# Patient Record
Sex: Female | Born: 1980 | Race: Black or African American | Hispanic: No | Marital: Single | Smoking: Former smoker
Health system: Southern US, Community
[De-identification: ages and names within clinical notes are randomized; demographics above are authoritative.]

## PROBLEM LIST (undated history)

## (undated) DIAGNOSIS — IMO0002 Reserved for concepts with insufficient information to code with codable children: Secondary | ICD-10-CM

## (undated) DIAGNOSIS — R87619 Unspecified abnormal cytological findings in specimens from cervix uteri: Secondary | ICD-10-CM

## (undated) DIAGNOSIS — K649 Unspecified hemorrhoids: Secondary | ICD-10-CM

## (undated) DIAGNOSIS — Z8619 Personal history of other infectious and parasitic diseases: Secondary | ICD-10-CM

## (undated) DIAGNOSIS — R079 Chest pain, unspecified: Secondary | ICD-10-CM

## (undated) HISTORY — DX: Unspecified abnormal cytological findings in specimens from cervix uteri: R87.619

## (undated) HISTORY — PX: NO PAST SURGERIES: SHX2092

## (undated) HISTORY — PX: OTHER SURGICAL HISTORY: SHX169

## (undated) HISTORY — DX: Reserved for concepts with insufficient information to code with codable children: IMO0002

## (undated) HISTORY — DX: Personal history of other infectious and parasitic diseases: Z86.19

---

## 2002-08-29 ENCOUNTER — Emergency Department (HOSPITAL_COMMUNITY): Admission: EM | Admit: 2002-08-29 | Discharge: 2002-08-30 | Payer: Self-pay | Admitting: Emergency Medicine

## 2006-07-30 ENCOUNTER — Emergency Department (HOSPITAL_COMMUNITY): Admission: EM | Admit: 2006-07-30 | Discharge: 2006-07-30 | Payer: Self-pay | Admitting: Emergency Medicine

## 2011-06-16 NOTE — L&D Delivery Note (Deleted)
Delivery Note At 9:35 AM a viable female was delivered via Vaginal, Vacuum (Extractor) (Presentation: Left Occiput Anterior).  APGAR: 8, 9; weight 8 lb 1.5 oz (3670 g).   Placenta status: Intact, Spontaneous.  Cord: 3 vessels  Anesthesia: Epidural Local  Episiotomy: None Lacerations: Bilateral hymenal ring tears Suture Repair: 3.0 vicryl Est. Blood Loss (mL): 400  Mom to postpartum.  Baby to nursery-stable.  Vonya Ohalloran H. 05/05/2012, 10:32 AM

## 2011-06-16 NOTE — L&D Delivery Note (Signed)
   Operative Delivery Note At 9:35 AM a viable and healthy female was delivered via Vaginal, Vacuum Investment banker, operational).  Presentation: vertex; Position: Left,, Occiput,, Anterior; Station: +5.  Verbal consent: obtained from patient.  Risks and benefits discussed in detail.  Risks include, but are not limited to the risks of anesthesia, bleeding, infection, damage to maternal tissues, fetal cephalhematoma.  There is also the risk of inability to effect vaginal delivery of the head, or shoulder dystocia that cannot be resolved by established maneuvers, leading to the need for emergency cesarean section.  APGAR: 8, 9; weight 8 lb 1.5 oz (3670 g).   Placenta status: Intact, Spontaneous.   Cord: 3 vessels  The patient was noted to be developing exhaustion but ineffectively pushing.  The option of vacuum assistance was offered due to maternal exhaustion.  The vacuum was applied with 4 pop-offs.  The vacuum was unable to generate adequate suction due to the abundance of hair on the infants head.  With a combination of maternal pushing and vacuum assistance delivered the infants head to crowning.  The vacuum was disengaged and the infants body was then delivered.  The cord was clamped and cut and the infant was passed to the isolet.  The placenta was delivered spontaneously, intact, with 3 vessel cord.  Bilateral hymenal ring tears were repaired with 3-0 vicryl.  EBL 400cc    Anesthesia: Epidural Local  Instruments: Kiwi vacuum Episiotomy: None Lacerations:  Suture Repair: 3.0 vicryl Est. Blood Loss (mL): 400  Mom to postpartum.  Baby to nursery-stable.  Zelma Mazariego H. 05/05/2012, 12:50 PM

## 2011-10-20 LAB — OB RESULTS CONSOLE RPR: RPR: NONREACTIVE

## 2011-10-20 LAB — OB RESULTS CONSOLE GC/CHLAMYDIA
Chlamydia: NEGATIVE
Gonorrhea: NEGATIVE

## 2011-10-20 LAB — OB RESULTS CONSOLE RUBELLA ANTIBODY, IGM: Rubella: IMMUNE

## 2011-10-20 LAB — OB RESULTS CONSOLE HEPATITIS B SURFACE ANTIGEN: Hepatitis B Surface Ag: NEGATIVE

## 2012-01-17 ENCOUNTER — Emergency Department (HOSPITAL_COMMUNITY)
Admission: EM | Admit: 2012-01-17 | Discharge: 2012-01-17 | Disposition: A | Discharge status: Home / Self Care | Payer: BC Managed Care – PPO | Attending: Emergency Medicine | Admitting: Emergency Medicine

## 2012-01-17 ENCOUNTER — Encounter (HOSPITAL_COMMUNITY): Payer: Self-pay | Admitting: *Deleted

## 2012-01-17 DIAGNOSIS — B86 Scabies: Secondary | ICD-10-CM

## 2012-01-17 DIAGNOSIS — R21 Rash and other nonspecific skin eruption: Secondary | ICD-10-CM | POA: Insufficient documentation

## 2012-01-17 MED ORDER — PERMETHRIN 5 % EX CREA: TOPICAL_CREAM | CUTANEOUS | Status: AC

## 2012-01-17 NOTE — ED Provider Notes (Signed)
History     CSN: [id number]  Arrival date & time 01/17/12  1731   First MD Initiated Contact with Patient 01/17/12 1747      Chief Complaint  Patient presents with  . Rash    (Consider location/radiation/quality/duration/timing/severity/associated sxs/prior treatment) HPI Comments: Patient presents today with a rash that has been present for the past 5 days.  Rash does itch, but is not painful.  Rash located on the web spaces of both hands and the lateral and posterior aspects of both feet.  She has never had a rash like this before.  She has tried putting Hydrocortisone on the rash, but does not feel that it has helped.  She denies fever or chills.  The history is provided by the patient.    History reviewed. No pertinent past medical history.  History reviewed. No pertinent past surgical history.  No family history on file.  History  Substance Use Topics  . Smoking status: Not on file  . Smokeless tobacco: Not on file  . Alcohol Use: No    OB History    Grav Para Term Preterm Abortions TAB SAB Ect Mult Living                  Review of Systems  Constitutional: Negative for fever and chills.  HENT: Negative for sore throat.   Gastrointestinal: Negative for nausea and vomiting.  Skin: Positive for rash.  Neurological: Negative for headaches.    Allergies  Review of patient's allergies indicates no known allergies.  Home Medications   Current Outpatient Rx  Name Route Sig Dispense Refill  . PRENATAL 27-0.8 MG PO TABS Oral Take 1 tablet by mouth daily.      BP 119/59  Pulse 88  Temp 97.8 F (36.6 C) (Oral)  Resp 16  SpO2 99%  Physical Exam  Nursing note and vitals reviewed. Constitutional: She appears well-developed and well-nourished. No distress.  HENT:  Head: Normocephalic and atraumatic.  Mouth/Throat: Oropharynx is clear and moist.  Neck: Normal range of motion. Neck supple.  Cardiovascular: Normal rate, regular rhythm and normal heart sounds.    Pulmonary/Chest: Effort normal and breath sounds normal.  Neurological: She is alert.  Skin: Skin is warm and dry. Rash noted. No petechiae and no purpura noted. She is not diaphoretic.       Papular rash and burrows located in the web spaces of the fingers, the web spaces of the toes, and the posterior/lateral portion of both feet.  Rash pruitic.    Psychiatric: She has a normal mood and affect.    ED Course  Procedures (including critical care time)  Labs Reviewed - No data to display No results found.   No diagnosis found.    MDM  Rash consistent with Scabies.  Patient treated with Permethrin cream.        Pascal Lux Ethel, PA-C 01/17/12 2037

## 2012-01-17 NOTE — ED Notes (Signed)
Discharge instructions reviewed w/ pt., verbalized understanding. One prescription provided at discharge.

## 2012-01-18 NOTE — ED Provider Notes (Signed)
Medical screening examination/treatment/procedure(s) were performed by non-physician practitioner and as supervising physician I was immediately available for consultation/collaboration.  Doug Sou, MD 01/18/12 [phone]

## 2012-02-14 ENCOUNTER — Inpatient Hospital Stay (HOSPITAL_COMMUNITY): Admission: AD | Admit: 2012-02-14 | Payer: Self-pay | Source: Ambulatory Visit | Admitting: Obstetrics and Gynecology

## 2012-05-02 ENCOUNTER — Encounter (HOSPITAL_COMMUNITY): Payer: Self-pay | Admitting: *Deleted

## 2012-05-02 ENCOUNTER — Telehealth (HOSPITAL_COMMUNITY): Payer: Self-pay | Admitting: *Deleted

## 2012-05-02 NOTE — Telephone Encounter (Signed)
Preadmission screen  

## 2012-05-03 ENCOUNTER — Telehealth (HOSPITAL_COMMUNITY): Payer: Self-pay | Admitting: *Deleted

## 2012-05-03 ENCOUNTER — Encounter (HOSPITAL_COMMUNITY): Payer: Self-pay | Admitting: *Deleted

## 2012-05-03 NOTE — Telephone Encounter (Signed)
Preadmission screen  

## 2012-05-05 ENCOUNTER — Inpatient Hospital Stay (HOSPITAL_COMMUNITY)
Admission: AD | Admit: 2012-05-05 | Discharge: 2012-05-07 | DRG: 373 | Disposition: A | Discharge status: Home / Self Care | Payer: BC Managed Care – PPO | Source: Ambulatory Visit | Attending: Obstetrics and Gynecology | Admitting: Obstetrics and Gynecology

## 2012-05-05 ENCOUNTER — Encounter (HOSPITAL_COMMUNITY): Payer: Self-pay | Admitting: *Deleted

## 2012-05-05 ENCOUNTER — Encounter (HOSPITAL_COMMUNITY): Payer: Self-pay | Admitting: Anesthesiology

## 2012-05-05 ENCOUNTER — Inpatient Hospital Stay (HOSPITAL_COMMUNITY): Payer: BC Managed Care – PPO | Admitting: Anesthesiology

## 2012-05-05 ENCOUNTER — Inpatient Hospital Stay (HOSPITAL_COMMUNITY): Admission: RE | Admit: 2012-05-05 | Payer: BC Managed Care – PPO | Source: Ambulatory Visit

## 2012-05-05 DIAGNOSIS — O99892 Other specified diseases and conditions complicating childbirth: Secondary | ICD-10-CM | POA: Diagnosis present

## 2012-05-05 DIAGNOSIS — Z22330 Carrier of Group B streptococcus: Secondary | ICD-10-CM

## 2012-05-05 LAB — CBC
HCT: 38.7 % (ref 36.0–46.0)
MCH: 32 pg (ref 26.0–34.0)
MCHC: 34.4 g/dL (ref 30.0–36.0)
MCV: 93.3 fL (ref 78.0–100.0)
Platelets: 231 10*3/uL (ref 150–400)
RDW: 13.3 % (ref 11.5–15.5)
WBC: 13.9 10*3/uL — ABNORMAL HIGH (ref 4.0–10.5)

## 2012-05-05 MED ORDER — PENICILLIN G POTASSIUM 5000000 UNITS IJ SOLR
2.5000 10*6.[IU] | INTRAVENOUS | Status: DC
  Administered 2012-05-05: 2.5 10*6.[IU] via INTRAVENOUS
  Filled 2012-05-05 (×3): qty 2.5

## 2012-05-05 MED ORDER — PENICILLIN G POTASSIUM 5000000 UNITS IJ SOLR
5.0000 10*6.[IU] | Freq: Once | INTRAVENOUS | Status: AC
  Administered 2012-05-05: 5 10*6.[IU] via INTRAVENOUS
  Filled 2012-05-05: qty 5

## 2012-05-05 MED ORDER — ONDANSETRON HCL 4 MG/2ML IJ SOLN: 4.0000 mg | INTRAMUSCULAR | Status: DC | PRN

## 2012-05-05 MED ORDER — SIMETHICONE 80 MG PO CHEW: 80.0000 mg | CHEWABLE_TABLET | ORAL | Status: DC | PRN

## 2012-05-05 MED ORDER — FLEET ENEMA 7-19 GM/118ML RE ENEM: 1.0000 | ENEMA | RECTAL | Status: DC | PRN

## 2012-05-05 MED ORDER — CITRIC ACID-SODIUM CITRATE 334-500 MG/5ML PO SOLN: 30.0000 mL | ORAL | Status: DC | PRN

## 2012-05-05 MED ORDER — ZOLPIDEM TARTRATE 5 MG PO TABS: 5.0000 mg | ORAL_TABLET | Freq: Every evening | ORAL | Status: DC | PRN

## 2012-05-05 MED ORDER — OXYTOCIN BOLUS FROM INFUSION: 500.0000 mL | INTRAVENOUS | Status: DC

## 2012-05-05 MED ORDER — LACTATED RINGERS IV SOLN: 500.0000 mL | Freq: Once | INTRAVENOUS | Status: DC

## 2012-05-05 MED ORDER — OXYCODONE-ACETAMINOPHEN 5-325 MG PO TABS
1.0000 | ORAL_TABLET | ORAL | Status: DC | PRN
  Administered 2012-05-07: 1 via ORAL
  Filled 2012-05-05: qty 1

## 2012-05-05 MED ORDER — PHENYLEPHRINE 40 MCG/ML (10ML) SYRINGE FOR IV PUSH (FOR BLOOD PRESSURE SUPPORT)
80.0000 ug | PREFILLED_SYRINGE | INTRAVENOUS | Status: DC | PRN
  Filled 2012-05-05: qty 5

## 2012-05-05 MED ORDER — HYDROCORTISONE ACE-PRAMOXINE 1-1 % RE FOAM
1.0000 | Freq: Two times a day (BID) | RECTAL | Status: DC
  Administered 2012-05-05 (×2): 1 via RECTAL
  Filled 2012-05-05: qty 10

## 2012-05-05 MED ORDER — EPHEDRINE 5 MG/ML INJ
10.0000 mg | INTRAVENOUS | Status: DC | PRN
  Administered 2012-05-05: 10 mg via INTRAVENOUS
  Filled 2012-05-05: qty 4

## 2012-05-05 MED ORDER — LACTATED RINGERS IV SOLN
INTRAVENOUS | Status: DC
  Administered 2012-05-05 (×2): via INTRAVENOUS

## 2012-05-05 MED ORDER — OXYCODONE-ACETAMINOPHEN 5-325 MG PO TABS: 1.0000 | ORAL_TABLET | ORAL | Status: DC | PRN

## 2012-05-05 MED ORDER — EPHEDRINE 5 MG/ML INJ: 10.0000 mg | INTRAVENOUS | Status: DC | PRN

## 2012-05-05 MED ORDER — LANOLIN HYDROUS EX OINT: TOPICAL_OINTMENT | CUTANEOUS | Status: DC | PRN

## 2012-05-05 MED ORDER — BENZOCAINE-MENTHOL 20-0.5 % EX AERO
1.0000 "application " | INHALATION_SPRAY | CUTANEOUS | Status: DC | PRN
  Administered 2012-05-05: 1 via TOPICAL
  Filled 2012-05-05 (×3): qty 56

## 2012-05-05 MED ORDER — OXYTOCIN 40 UNITS IN LACTATED RINGERS INFUSION - SIMPLE MED
62.5000 mL/h | INTRAVENOUS | Status: DC
  Administered 2012-05-05: 62.5 mL/h via INTRAVENOUS
  Filled 2012-05-05: qty 1000

## 2012-05-05 MED ORDER — IBUPROFEN 600 MG PO TABS: 600.0000 mg | ORAL_TABLET | Freq: Four times a day (QID) | ORAL | Status: DC | PRN

## 2012-05-05 MED ORDER — PHENYLEPHRINE 40 MCG/ML (10ML) SYRINGE FOR IV PUSH (FOR BLOOD PRESSURE SUPPORT): 80.0000 ug | PREFILLED_SYRINGE | INTRAVENOUS | Status: DC | PRN

## 2012-05-05 MED ORDER — ACETAMINOPHEN 325 MG PO TABS: 650.0000 mg | ORAL_TABLET | ORAL | Status: DC | PRN

## 2012-05-05 MED ORDER — DIPHENHYDRAMINE HCL 25 MG PO CAPS: 25.0000 mg | ORAL_CAPSULE | Freq: Four times a day (QID) | ORAL | Status: DC | PRN

## 2012-05-05 MED ORDER — PRENATAL MULTIVITAMIN CH
1.0000 | ORAL_TABLET | Freq: Every day | ORAL | Status: DC
  Administered 2012-05-05 – 2012-05-07 (×3): 1 via ORAL
  Filled 2012-05-05 (×3): qty 1

## 2012-05-05 MED ORDER — TETANUS-DIPHTH-ACELL PERTUSSIS 5-2.5-18.5 LF-MCG/0.5 IM SUSP
0.5000 mL | Freq: Once | INTRAMUSCULAR | Status: AC
  Administered 2012-05-06: 0.5 mL via INTRAMUSCULAR
  Filled 2012-05-05 (×2): qty 0.5

## 2012-05-05 MED ORDER — LIDOCAINE HCL (PF) 1 % IJ SOLN
30.0000 mL | INTRAMUSCULAR | Status: DC | PRN
  Filled 2012-05-05: qty 30

## 2012-05-05 MED ORDER — LIDOCAINE HCL (PF) 1 % IJ SOLN
INTRAMUSCULAR | Status: DC | PRN
  Administered 2012-05-05: 4 mL
  Administered 2012-05-05: 30 mL via SUBCUTANEOUS
  Administered 2012-05-05 (×3): 4 mL

## 2012-05-05 MED ORDER — WITCH HAZEL-GLYCERIN EX PADS
1.0000 "application " | MEDICATED_PAD | CUTANEOUS | Status: DC | PRN
  Administered 2012-05-05: 1 via TOPICAL

## 2012-05-05 MED ORDER — IBUPROFEN 600 MG PO TABS
600.0000 mg | ORAL_TABLET | Freq: Four times a day (QID) | ORAL | Status: DC
  Administered 2012-05-05 – 2012-05-07 (×9): 600 mg via ORAL
  Filled 2012-05-05 (×9): qty 1

## 2012-05-05 MED ORDER — FENTANYL 2.5 MCG/ML BUPIVACAINE 1/10 % EPIDURAL INFUSION (WH - ANES)
14.0000 mL/h | INTRAMUSCULAR | Status: DC
  Administered 2012-05-05: 14 mL/h via EPIDURAL
  Filled 2012-05-05: qty 125

## 2012-05-05 MED ORDER — DIPHENHYDRAMINE HCL 50 MG/ML IJ SOLN: 12.5000 mg | INTRAMUSCULAR | Status: DC | PRN

## 2012-05-05 MED ORDER — LACTATED RINGERS IV SOLN
500.0000 mL | INTRAVENOUS | Status: DC | PRN
  Administered 2012-05-05: 500 mL via INTRAVENOUS

## 2012-05-05 MED ORDER — SENNOSIDES-DOCUSATE SODIUM 8.6-50 MG PO TABS
2.0000 | ORAL_TABLET | Freq: Every day | ORAL | Status: DC
  Administered 2012-05-05 – 2012-05-06 (×2): 2 via ORAL

## 2012-05-05 MED ORDER — ONDANSETRON HCL 4 MG PO TABS: 4.0000 mg | ORAL_TABLET | ORAL | Status: DC | PRN

## 2012-05-05 MED ORDER — DIBUCAINE 1 % RE OINT
1.0000 "application " | TOPICAL_OINTMENT | RECTAL | Status: DC | PRN
  Filled 2012-05-05: qty 28

## 2012-05-05 MED ORDER — ONDANSETRON HCL 4 MG/2ML IJ SOLN: 4.0000 mg | Freq: Four times a day (QID) | INTRAMUSCULAR | Status: DC | PRN

## 2012-05-05 NOTE — Progress Notes (Signed)
Pt states pain in a 8-9 when she has a contractions

## 2012-05-05 NOTE — MAU Note (Signed)
Pt feels like she was having contraction at midnight. Pt state she broke her water broke at about later pt states her water broke

## 2012-05-05 NOTE — OB Triage Provider Note (Signed)
S: Pt here for labor eval, and thinks that her water has broken. She felt a small gush, and the RN asked me to perform a SSE O: VSS, afeb FHT:135, moderate, 15X15 accels, no decels Toco: q2-3 min Breathing through contractions External:  Normal Vagina: normal, pooling of a large amount of clear mucous/fluid + Fern Cervix: 5/100/-1 A/P: SROM Active labor Reassuring M-F status RN will update the patient's private MD

## 2012-05-05 NOTE — H&P (Signed)
31 y.o. [redacted]w[redacted]d  G1P0 comes in c/o ctx beginning around 12am with SROM shortley thereafter.  Otherwise has good fetal movement and no bleeding.  Past Medical History  Diagnosis Date  . H/O varicella   . Abnormal Pap smear   . Hx of chlamydia infection     Past Surgical History  Procedure Date  . No past surgeries     OB History    Grav Para Term Preterm Abortions TAB SAB Ect Mult Living   1              # Outc Date GA Lbr Len/2nd Wgt Sex Del Anes PTL Lv   1 CUR               History   Social History  . Marital Status: Single    Spouse Name: N/A    Number of Children: N/A  . Years of Education: N/A   Occupational History  . Not on file.   Social History Main Topics  . Smoking status: Former Smoker    Quit date: 10/02/2011  . Smokeless tobacco: Not on file  . Alcohol Use: No  . Drug Use: No  . Sexually Active: Yes   Other Topics Concern  . Not on file   Social History Narrative  . No narrative on file   Review of patient's allergies indicates no known allergies.    Prenatal Transfer Tool  Maternal Diabetes: No Genetic Screening: Normal Maternal Ultrasounds/Referrals: Normal Fetal Ultrasounds or other Referrals:  None Maternal Substance Abuse:  No Significant Maternal Medications:  None Significant Maternal Lab Results: None  Other PNC: O+/RI, nl 1hr gct    Filed Vitals:   05/05/12 0700  BP: 117/74  Pulse: 113  Temp:   Resp: 18     Lungs/Cor:  NAD Abdomen:  soft, gravid Ex:  no cords, erythema SVE:  5/100/-1 at admission- currently 10/100/+2 FHTs:  155, good STV, NST R Toco:  q 1-3   A/P   Admit for labor  Epidural desired - given  GBS Pos - PCN started Alerted by RN at approx 4:30am that pt had epidural approx  3:30 and was now having repetative lates despite O2 and position change.  On review of strip and vital signs noted decrease pt's BP by approx 40 systolic from prior to epidural.  Advised to administer ephedrine and monitor.  On review  repetetive late decels resolved.  Alerted that pt was rechecked at approx 6:30am and was complete but 0 station with no maternal pressure or urge to push, some caput noted.   Philip Aspen

## 2012-05-05 NOTE — Progress Notes (Signed)
Per RN pt not feeling anything to assist in pushing.  Has done trial pushes and while patient is trying to push the head actually ascends.  RN turned down epidural and will start pushing again in 15 min.  New FSE placed prior to marked variability.  Asked RN to switch to external to see if marked variability is artifact.  Can hear FHT with external and appears normal.

## 2012-05-05 NOTE — Anesthesia Preprocedure Evaluation (Signed)

## 2012-05-05 NOTE — Progress Notes (Signed)
Pt comfortable with epidural.  FHT 150 with +accels.  Once late decel noted on recent contraction. Last several minutes with some marked variability - FSE in place.  Overall reassurring.   TOCO q1-3 SVE 10/100 +2 Will start pushing.

## 2012-05-05 NOTE — Progress Notes (Signed)
Dr. Claiborne Billings notified of fhr tracing repetitive lates and prolonged decels. TO given to give 10 mg ephedrine IVP. Told to call back if ineffective.

## 2012-05-05 NOTE — MAU Note (Signed)
Pt G1 at 41.1wks leaking clear fluid x 1 hr, having contractions.

## 2012-05-05 NOTE — Anesthesia Postprocedure Evaluation (Signed)
  Anesthesia Post-op Note  Patient: Marilyn Luna  Procedure(s) Performed: * No procedures listed *  Patient Location: Mother/Baby  Anesthesia Type:Epidural  Level of Consciousness: awake, alert  and oriented  Airway and Oxygen Therapy: Patient Spontanous Breathing  Post-op Pain: none  Post-op Assessment: Post-op Vital signs reviewed and Patient's Cardiovascular Status Stable  Post-op Vital Signs: Reviewed and stable  Complications: No apparent anesthesia complications

## 2012-05-05 NOTE — Anesthesia Procedure Notes (Signed)
Epidural Patient location during procedure: OB Start time: 05/05/2012 3:26 AM  Staffing Performed by: anesthesiologist   Preanesthetic Checklist Completed: patient identified, site marked, surgical consent, pre-op evaluation, timeout performed, IV checked, risks and benefits discussed and monitors and equipment checked  Epidural Patient position: sitting Prep: site prepped and draped and DuraPrep Patient monitoring: continuous pulse ox and blood pressure Approach: midline Injection technique: LOR air  Needle:  Needle type: Tuohy  Needle gauge: 17 G Needle length: 9 cm and 9 Needle insertion depth: 5 cm cm Catheter type: closed end flexible Catheter size: 19 Gauge Catheter at skin depth: 10 cm Test dose: negative  Assessment Events: blood not aspirated, injection not painful, no injection resistance, negative IV test and no paresthesia  Additional Notes Discussed risk of headache, infection, bleeding, nerve injury and failed or incomplete block.  Patient voices understanding and wishes to proceed. Reason for block:procedure for pain

## 2012-05-05 NOTE — Progress Notes (Signed)
Dr. Claiborne Billings notified of pt's SVE and FHR tracing with variables and lates. States she will come in and evaluate patient in person.

## 2012-05-06 LAB — CBC
HCT: 31.3 % — ABNORMAL LOW (ref 36.0–46.0)
Hemoglobin: 10.4 g/dL — ABNORMAL LOW (ref 12.0–15.0)
MCV: 94.6 fL (ref 78.0–100.0)
RDW: 13.5 % (ref 11.5–15.5)
WBC: 11.9 10*3/uL — ABNORMAL HIGH (ref 4.0–10.5)

## 2012-05-06 NOTE — Progress Notes (Signed)
Parents desires circumsision.  All risks, benefits and alternatives discussed with the mother. 

## 2012-05-06 NOTE — Progress Notes (Signed)
Patient is eating, ambulating, voiding.  Pain control is good.  Filed Vitals:   05/05/12 1115 05/05/12 1130 05/05/12 1625 05/05/12 2320  BP: 107/63 117/58 115/68 123/77  Pulse: 104 100 101 97  Temp:   99.6 F (37.6 C) 98.6 F (37 C)  TempSrc:   Oral Oral  Resp:   20 20  Height:      Weight:      SpO2:   99% 98%    Fundus firm Perineum without swelling.  Lab Results  Component Value Date   WBC 11.9* 05/06/2012   HGB 10.4* 05/06/2012   HCT 31.3* 05/06/2012   MCV 94.6 05/06/2012   PLT 192 05/06/2012    O/Positive/-- (05/07 0000)/RI  A/P Post partum day 1.  Routine care.  Expect d/c tomorrow.    Marilyn Luna A

## 2012-05-06 NOTE — Discharge Summary (Signed)
Obstetric Discharge Summary Reason for Admission: onset of labor Prenatal Procedures: none Intrapartum Procedures: vacuum Postpartum Procedures: none Complications-Operative and Postpartum: 1 degree perineal laceration Hemoglobin  Date Value Range Status  05/06/2012 10.4* 12.0 - 15.0 g/dL Final     REPEATED TO VERIFY     DELTA CHECK NOTED     HCT  Date Value Range Status  05/06/2012 31.3* 36.0 - 46.0 % Final    Discharge Diagnoses: Term Pregnancy-delivered  Discharge Information: Date: 05/06/2012 Activity: pelvic rest Diet: routine Medications: Ibuprofen Condition: stable Instructions: refer to practice specific booklet Discharge to: home Follow-up Information    Follow up with Almon Hercules., MD. Schedule an appointment as soon as possible for a visit in 4 weeks.   Contact information:   [address] [place] [address] [phone]          Newborn Data: Live born female  Birth Weight: 8 lb 1.5 oz (3670 g) APGAR: 8, 9  Home with mother.  Ostin Mathey A 05/06/2012, 8:01 AM

## 2012-05-07 NOTE — Progress Notes (Signed)
Patient is eating, ambulating, voiding.  Pain control is good.  Filed Vitals:   05/05/12 2320 05/06/12 1412 05/06/12 2211 05/07/12 0554  BP: 123/77 123/76 113/73 103/65  Pulse: 97 118 86 72  Temp: 98.6 F (37 C) 98 F (36.7 C) 98.2 F (36.8 C) 98.2 F (36.8 C)  TempSrc: Oral Oral Oral Oral  Resp: 20 18 20 18   Height:      Weight:      SpO2: 98%       Fundus firm Perineum without swelling.  Lab Results  Component Value Date   WBC 11.9* 05/06/2012   HGB 10.4* 05/06/2012   HCT 31.3* 05/06/2012   MCV 94.6 05/06/2012   PLT 192 05/06/2012    --/--/O POS (11/22 0515)/RI  A/P Post partum day 2.  Routine care.  Expect d/c today.    Melik Blancett A

## 2012-05-10 NOTE — Progress Notes (Signed)
Post discharge chart review completed.  

## 2012-07-30 ENCOUNTER — Other Ambulatory Visit: Payer: Self-pay

## 2013-04-20 ENCOUNTER — Other Ambulatory Visit: Payer: Self-pay

## 2014-04-16 ENCOUNTER — Encounter (HOSPITAL_COMMUNITY): Payer: Self-pay | Admitting: *Deleted

## 2014-04-20 ENCOUNTER — Emergency Department (HOSPITAL_COMMUNITY): Payer: No Typology Code available for payment source

## 2014-04-20 ENCOUNTER — Emergency Department (HOSPITAL_COMMUNITY)
Admission: EM | Admit: 2014-04-20 | Discharge: 2014-04-20 | Disposition: A | Discharge status: Home / Self Care | Payer: No Typology Code available for payment source | Attending: Emergency Medicine | Admitting: Emergency Medicine

## 2014-04-20 ENCOUNTER — Encounter (HOSPITAL_COMMUNITY): Payer: Self-pay | Admitting: Emergency Medicine

## 2014-04-20 DIAGNOSIS — S4991XA Unspecified injury of right shoulder and upper arm, initial encounter: Secondary | ICD-10-CM | POA: Insufficient documentation

## 2014-04-20 DIAGNOSIS — Z87891 Personal history of nicotine dependence: Secondary | ICD-10-CM | POA: Insufficient documentation

## 2014-04-20 DIAGNOSIS — Y9389 Activity, other specified: Secondary | ICD-10-CM | POA: Insufficient documentation

## 2014-04-20 DIAGNOSIS — Z8619 Personal history of other infectious and parasitic diseases: Secondary | ICD-10-CM | POA: Diagnosis not present

## 2014-04-20 DIAGNOSIS — Z79899 Other long term (current) drug therapy: Secondary | ICD-10-CM | POA: Insufficient documentation

## 2014-04-20 DIAGNOSIS — Y92410 Unspecified street and highway as the place of occurrence of the external cause: Secondary | ICD-10-CM | POA: Diagnosis not present

## 2014-04-20 DIAGNOSIS — S199XXA Unspecified injury of neck, initial encounter: Secondary | ICD-10-CM | POA: Diagnosis present

## 2014-04-20 DIAGNOSIS — M542 Cervicalgia: Secondary | ICD-10-CM

## 2014-04-20 MED ORDER — METHOCARBAMOL 500 MG PO TABS: 500.0000 mg | ORAL_TABLET | Freq: Two times a day (BID) | ORAL | Status: DC

## 2014-04-20 MED ORDER — IBUPROFEN 800 MG PO TABS
800.0000 mg | ORAL_TABLET | Freq: Once | ORAL | Status: AC
  Administered 2014-04-20: 800 mg via ORAL
  Filled 2014-04-20: qty 1

## 2014-04-20 MED ORDER — IBUPROFEN 800 MG PO TABS: 800.0000 mg | ORAL_TABLET | Freq: Three times a day (TID) | ORAL | Status: DC
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
## 2014-07-13 ENCOUNTER — Ambulatory Visit: Payer: Self-pay | Admitting: Family Medicine

## 2014-07-27 ENCOUNTER — Ambulatory Visit: Payer: Self-pay | Admitting: Family Medicine

## 2016-06-02 DIAGNOSIS — Z124 Encounter for screening for malignant neoplasm of cervix: Secondary | ICD-10-CM | POA: Diagnosis not present

## 2016-06-02 DIAGNOSIS — Z113 Encounter for screening for infections with a predominantly sexual mode of transmission: Secondary | ICD-10-CM | POA: Diagnosis not present

## 2016-06-02 DIAGNOSIS — Z6826 Body mass index (BMI) 26.0-26.9, adult: Secondary | ICD-10-CM | POA: Diagnosis not present

## 2016-06-02 DIAGNOSIS — Z01419 Encounter for gynecological examination (general) (routine) without abnormal findings: Secondary | ICD-10-CM | POA: Diagnosis not present

## 2016-08-13 DIAGNOSIS — R399 Unspecified symptoms and signs involving the genitourinary system: Secondary | ICD-10-CM | POA: Diagnosis not present

## 2016-08-13 DIAGNOSIS — N898 Other specified noninflammatory disorders of vagina: Secondary | ICD-10-CM | POA: Diagnosis not present

## 2016-08-13 DIAGNOSIS — Z6825 Body mass index (BMI) 25.0-25.9, adult: Secondary | ICD-10-CM | POA: Diagnosis not present

## 2016-11-18 DIAGNOSIS — K649 Unspecified hemorrhoids: Secondary | ICD-10-CM | POA: Diagnosis not present

## 2016-11-24 DIAGNOSIS — K6289 Other specified diseases of anus and rectum: Secondary | ICD-10-CM | POA: Diagnosis not present

## 2016-11-24 DIAGNOSIS — K644 Residual hemorrhoidal skin tags: Secondary | ICD-10-CM | POA: Diagnosis not present

## 2016-11-24 DIAGNOSIS — K625 Hemorrhage of anus and rectum: Secondary | ICD-10-CM | POA: Diagnosis not present

## 2017-06-16 DIAGNOSIS — Z124 Encounter for screening for malignant neoplasm of cervix: Secondary | ICD-10-CM | POA: Diagnosis not present

## 2017-06-16 DIAGNOSIS — Z01419 Encounter for gynecological examination (general) (routine) without abnormal findings: Secondary | ICD-10-CM | POA: Diagnosis not present

## 2017-06-16 DIAGNOSIS — Z6824 Body mass index (BMI) 24.0-24.9, adult: Secondary | ICD-10-CM | POA: Diagnosis not present

## 2017-07-02 DIAGNOSIS — R590 Localized enlarged lymph nodes: Secondary | ICD-10-CM | POA: Diagnosis not present

## 2017-07-02 DIAGNOSIS — R591 Generalized enlarged lymph nodes: Secondary | ICD-10-CM | POA: Diagnosis not present

## 2017-07-10 DIAGNOSIS — R0989 Other specified symptoms and signs involving the circulatory and respiratory systems: Secondary | ICD-10-CM | POA: Diagnosis not present

## 2017-07-10 DIAGNOSIS — R590 Localized enlarged lymph nodes: Secondary | ICD-10-CM | POA: Diagnosis not present

## 2017-07-15 DIAGNOSIS — L500 Allergic urticaria: Secondary | ICD-10-CM | POA: Diagnosis not present

## 2017-07-15 DIAGNOSIS — L259 Unspecified contact dermatitis, unspecified cause: Secondary | ICD-10-CM | POA: Diagnosis not present

## 2017-08-04 ENCOUNTER — Ambulatory Visit (INDEPENDENT_AMBULATORY_CARE_PROVIDER_SITE_OTHER): Payer: BLUE CROSS/BLUE SHIELD | Admitting: Family Medicine

## 2017-08-04 ENCOUNTER — Other Ambulatory Visit: Payer: Self-pay

## 2017-08-04 ENCOUNTER — Encounter: Payer: Self-pay | Admitting: Family Medicine

## 2017-08-04 VITALS — BP 122/68 | HR 102 | Temp 98.5°F | Ht 68.0 in | Wt 165.0 lb

## 2017-08-04 DIAGNOSIS — Z7689 Persons encountering health services in other specified circumstances: Secondary | ICD-10-CM

## 2017-08-04 DIAGNOSIS — Z3009 Encounter for other general counseling and advice on contraception: Secondary | ICD-10-CM | POA: Diagnosis not present

## 2017-08-04 NOTE — Patient Instructions (Signed)
Thank you for coming to see me today. It was a pleasure meeting you! Today we talked about:   Your contraception options. Please let me know if you decide you would like an IUD.   Please follow-up with me in 1 year or sooner if needed.  If you have any questions or concerns, please do not hesitate to call the office at [phone].  Take Care,   [place] Allex Lapoint, DO

## 2017-08-04 NOTE — Progress Notes (Signed)
   Subjective:    Patient ID: Marilyn Luna, female    DOB: 1980/06/19, 37 y.o.   MRN: [id number]   CC: establishing care  HPI:  Health Maintenance: - Patient here to establish care with no concerns at this time - Up to date on pap smear per patient - Interested in contraception options  Review of Systems  Constitutional: Negative for chills and fever.  HENT: Negative for congestion.   Eyes: Negative for blurred vision and double vision.  Respiratory: Negative for shortness of breath.   Cardiovascular: Negative for chest pain and leg swelling.  Gastrointestinal: Negative for abdominal pain.  Genitourinary: Negative for dysuria and frequency.  Musculoskeletal: Positive for joint pain.  Skin: Negative for rash.  Neurological: Negative for dizziness and headaches.  Psychiatric/Behavioral: Negative for depression.   Family History  Problem Relation Age of Onset  . Hypertension Maternal Aunt   . Cancer Paternal Uncle        lung  . Diabetes Maternal Grandmother   . Asthma Brother     Past Medical History:  Diagnosis Date  . Abnormal Pap smear   . H/O varicella   . Hx of chlamydia infection    Social Hx: Denies tobacco use (recently quit 07/04/17, smoked for 12 years- 0.5ppd) or illicit drug use. Reports occasionally alcohol use- wine 1-2 glasses maybe once per week. 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 Has 35 yo.  Objective:  BP 122/68   Pulse (!) 102   Temp 98.5 F (36.9 C) (Oral)   Ht 5\' 8"  (1.727 m)   Wt 165 lb (74.8 kg)   LMP 07/07/2017 (Exact Date)   SpO2 98%   BMI 25.09 kg/m  Vitals and nursing note reviewed  General: NAD, pleasant Head: Atraumatic Neck: Supple, no lymphadenopathy Cardiac: RRR, normal heart sounds, no murmurs Respiratory: CTAB, normal effort Abdomen: soft, nontender, nondistended. Bowel sounds present Extremities: no edema or cyanosis. WWP. MSK: normal gait Skin: warm and dry, no rashes noted Neuro: alert and oriented, no focal  deficits Psych: Neatly groomed and appropriately dressed. Maintains good eye contact and is cooperative and attentive. Speech is normal volume and rate. Denies SI/ HI. Normal affect.  Assessment & Plan:  Health maintenance:  Patient reporting that she is up to date on pap smear.  Contraceptive management Patient counseled on options including OCPs, IUD, and Depo shot. She did not like the Nexplanon she had in after pregnancy, but is interested in the IUD. Informed of risks and benefits of each option.    [place] Zilpha Mcandrew, DO Family Medicine Resident, PGY-1

## 2017-08-06 DIAGNOSIS — Z309 Encounter for contraceptive management, unspecified: Secondary | ICD-10-CM | POA: Insufficient documentation

## 2017-08-06 NOTE — Assessment & Plan Note (Addendum)
Patient counseled on options including OCPs, IUD, and Depo shot. She did not like the Nexplanon she had in after pregnancy, but is interested in the IUD. Informed of risks and benefits of each option.

## 2017-08-16 DIAGNOSIS — Z87891 Personal history of nicotine dependence: Secondary | ICD-10-CM | POA: Diagnosis not present

## 2017-08-16 DIAGNOSIS — R590 Localized enlarged lymph nodes: Secondary | ICD-10-CM | POA: Diagnosis not present

## 2017-12-17 ENCOUNTER — Ambulatory Visit (HOSPITAL_COMMUNITY)
Admission: RE | Admit: 2017-12-17 | Discharge: 2017-12-17 | Disposition: A | Discharge status: Home / Self Care | Payer: BLUE CROSS/BLUE SHIELD | Source: Ambulatory Visit | Attending: Family Medicine | Admitting: Family Medicine

## 2017-12-17 ENCOUNTER — Other Ambulatory Visit: Payer: Self-pay

## 2017-12-17 ENCOUNTER — Ambulatory Visit (INDEPENDENT_AMBULATORY_CARE_PROVIDER_SITE_OTHER): Payer: BLUE CROSS/BLUE SHIELD | Admitting: Family Medicine

## 2017-12-17 ENCOUNTER — Encounter: Payer: Self-pay | Admitting: Family Medicine

## 2017-12-17 ENCOUNTER — Telehealth: Payer: Self-pay | Admitting: Family Medicine

## 2017-12-17 VITALS — BP 100/60 | HR 68 | Temp 98.4°F | Wt 170.0 lb

## 2017-12-17 DIAGNOSIS — M79672 Pain in left foot: Secondary | ICD-10-CM

## 2017-12-17 NOTE — Telephone Encounter (Signed)
Xray result discussed with her.   Dg Foot Complete Left  Result Date: 12/17/2017 CLINICAL DATA:  Left foot pain EXAM: LEFT FOOT - COMPLETE 3+ VIEW COMPARISON:  None. FINDINGS: There is no evidence of fracture or dislocation. There is no evidence of arthropathy or other focal bone abnormality. Soft tissues are unremarkable. IMPRESSION: Negative. Electronically Signed   By: Charlett NoseKevin  Dover M.D.   On: 12/17/2017 13:08

## 2017-12-17 NOTE — Patient Instructions (Signed)
Foot Pain Many things can cause foot pain. Some common causes are:  An injury.  A sprain.  Arthritis.  Blisters.  Bunions.  Follow these instructions at home: Pay attention to any changes in your symptoms. Take these actions to help with your discomfort:  If directed, put ice on the affected area: ? Put ice in a plastic bag. ? Place a towel between your skin and the bag. ? Leave the ice on for 15-20 minutes, 3?4 times a day for 2 days.  Take over-the-counter and prescription medicines only as told by your health care provider.  Wear comfortable, supportive shoes that fit you well. Do not wear high heels.  Do not stand or walk for long periods of time.  Do not lift a lot of weight. This can put added pressure on your feet.  Do stretches to relieve foot pain and stiffness as told by your health care provider.  Rub your foot gently.  Keep your feet clean and dry.  Contact a health care provider if:  Your pain does not get better after a few days of self-care.  Your pain gets worse.  You cannot stand on your foot. Get help right away if:  Your foot is numb or tingling.  Your foot or toes are swollen.  Your foot or toes turn white or blue.  You have warmth and redness along your foot. This information is not intended to replace advice given to you by your health care provider. Make sure you discuss any questions you have with your health care provider. Document Released: 06/28/2015 Document Revised: 11/07/2015 Document Reviewed: 06/27/2014 Elsevier Interactive Patient Education  2018 Elsevier Inc.  

## 2017-12-17 NOTE — Progress Notes (Signed)
  Subjective:     Patient ID: Marilyn Luna, female   DOB: 1981-02-11, 37 y.o.   MRN: [id number]  Foot Pain  This is a new (C/O left foot pain) problem. Episode onset: 3 weeks ago. The problem occurs constantly (Started since she started working out. She does boot camp, a bit intense, she cut back a bit since her foot started hurting). The problem has been unchanged. Pertinent negatives include no fever, joint swelling, myalgias or numbness. The symptoms are aggravated by standing and walking. She has tried lying down, rest and ice for the symptoms. The treatment provided mild relief.  HM:She had PAP this year with her Gynecologist.  Current Outpatient Medications on File Prior to Visit  Medication Sig Dispense Refill  . BIOTIN PO Take 1 tablet by mouth daily.    Marland Kitchen. ibuprofen (ADVIL,MOTRIN) 800 MG tablet Take 1 tablet (800 mg total) by mouth 3 (three) times daily. (Patient not taking: Reported on 12/17/2017) 21 tablet 0   No current facility-administered medications on file prior to visit.    Past Medical History:  Diagnosis Date  . Abnormal Pap smear   . H/O varicella   . Hx of chlamydia infection    Vitals:   12/17/17 1131  BP: 100/60  Pulse: 68  Temp: 98.4 F (36.9 C)  TempSrc: Oral  SpO2: 99%  Weight: 170 lb (77.1 kg)      Review of Systems  Constitutional: Negative for fever.  Respiratory: Negative.   Cardiovascular: Negative.   Gastrointestinal: Negative.   Musculoskeletal: Negative for joint swelling and myalgias.       Left foot pain  Neurological: Negative for numbness.  All other systems reviewed and are negative.      Objective:   Physical Exam  Constitutional: She appears well-developed. No distress.  Cardiovascular: Normal rate, regular rhythm and normal heart sounds.  No murmur heard. Pulmonary/Chest: Effort normal and breath sounds normal. No stridor. No respiratory distress. She has no wheezes.  Musculoskeletal:       Feet:  Nursing note and vitals  reviewed.      Assessment:     Left foot pain.     Plan:     ?? Ligament sprain vs stress fracture. Management will include rest and wearing of comfortable shoes. She prefers to get an xray to evaluate. She is advised that xray may be neg even if she has stress fracture, but since it is well over 2 weeks, I will go ahead and obtain xray. In the mean time, use Ibuprofen as needed for pain. Wear comfortable shoes and rest foot prn. F/U if worsening.   Per patient, she is up to date with PAP. Obtain record for us.

## 2018-01-05 ENCOUNTER — Other Ambulatory Visit: Payer: Self-pay

## 2018-01-05 ENCOUNTER — Ambulatory Visit (INDEPENDENT_AMBULATORY_CARE_PROVIDER_SITE_OTHER): Payer: BLUE CROSS/BLUE SHIELD | Admitting: Family Medicine

## 2018-01-05 ENCOUNTER — Encounter: Payer: Self-pay | Admitting: Family Medicine

## 2018-01-05 VITALS — BP 126/64 | HR 63 | Wt 170.0 lb

## 2018-01-05 DIAGNOSIS — R21 Rash and other nonspecific skin eruption: Secondary | ICD-10-CM | POA: Insufficient documentation

## 2018-01-05 NOTE — Assessment & Plan Note (Signed)
Patient presents with left eye rash for the past 3 days which has significantly improved with hydrocortisone and Benadryl.  Rash appears to be secondary to insect bite, bite mark seen on exam.  At this point patient is experiencing pruritus for which she is taking Benadryl, however erythema and warmth have almost completely resolved.  Patient was reassured.  Recommended using mentholated ointment to help with itching as needed.

## 2018-01-05 NOTE — Progress Notes (Signed)
   Subjective:    Patient ID: Marilyn Luna, female    DOB: 1980-12-03, 37 y.o.   MRN: [id number]   CC: Rash secondary to insect bite  HPI: Patient is a 37 year old female who presents today complaining of rash on her left thigh.Patient reports she was bitten by an insect/bee (unsure) this past weekend.  Patient reports that initially she had a large erythematous area on her thigh and has been experiencing significant itching and warmth.  Patient reports that she has been using hydrocortisone Benadryl for itching.  There has been significant improvement in redness patient continues to endorse significant pruritus.  Patient denies any headache, vision changes, fever, chills, nausea, vomiting.  Smoking status reviewed   ROS: all other systems were reviewed and are negative other than in the HPI   Past Medical History:  Diagnosis Date  . Abnormal Pap smear   . H/O varicella   . Hx of chlamydia infection     Past Surgical History:  Procedure Laterality Date  . NO PAST SURGERIES      Past medical history, surgical, family, and social history reviewed and updated in the EMR as appropriate.  Objective:  BP 126/64   Pulse 63   Wt 170 lb (77.1 kg)   LMP 12/06/2017   SpO2 98%   Breastfeeding? No   BMI 25.85 kg/m   Vitals and nursing note reviewed  General: NAD, pleasant, able to participate in exam Cardiac: RRR, normal heart sounds, no murmurs. 2+ radial and PT pulses bilaterally Respiratory: CTAB, normal effort, No wheezes, rales or rhonchi Abdomen: soft, nontender, nondistended, no hepatic or splenomegaly, +BS Extremities: no edema or cyanosis. WWP. Skin: 5 cm x 5 cm erythematous macule noted on anterior left thigh, with bite mark.  No excoriation, drainage, swelling noted. Neuro: alert and oriented x4, no focal deficits Psych: Normal affect and mood   Assessment & Plan:    Rash and nonspecific skin eruption Patient presents with left eye rash for the past 3 days which has  significantly improved with hydrocortisone and Benadryl.  Rash appears to be secondary to insect bite, bite mark seen on exam.  At this point patient is experiencing pruritus for which she is taking Benadryl, however erythema and warmth have almost completely resolved.  Patient was reassured.  Recommended using mentholated ointment to help with itching as needed.    Lovena NeighboursAbdoulaye Chaitanya Amedee, MD [facility] PGY-3

## 2018-07-07 DIAGNOSIS — Z113 Encounter for screening for infections with a predominantly sexual mode of transmission: Secondary | ICD-10-CM | POA: Diagnosis not present

## 2018-07-07 DIAGNOSIS — Z6826 Body mass index (BMI) 26.0-26.9, adult: Secondary | ICD-10-CM | POA: Diagnosis not present

## 2018-07-07 DIAGNOSIS — Z01419 Encounter for gynecological examination (general) (routine) without abnormal findings: Secondary | ICD-10-CM | POA: Diagnosis not present

## 2018-07-07 DIAGNOSIS — Z124 Encounter for screening for malignant neoplasm of cervix: Secondary | ICD-10-CM | POA: Diagnosis not present

## 2019-04-06 ENCOUNTER — Ambulatory Visit: Payer: BC Managed Care – PPO

## 2019-04-06 ENCOUNTER — Other Ambulatory Visit: Payer: Self-pay

## 2019-05-18 ENCOUNTER — Encounter: Payer: Self-pay | Admitting: Family Medicine

## 2019-05-18 ENCOUNTER — Ambulatory Visit: Payer: BC Managed Care – PPO | Admitting: Family Medicine

## 2019-05-18 ENCOUNTER — Other Ambulatory Visit: Payer: Self-pay

## 2019-05-18 VITALS — BP 100/70 | HR 82 | Wt 173.4 lb

## 2019-05-18 DIAGNOSIS — M5489 Other dorsalgia: Secondary | ICD-10-CM | POA: Diagnosis not present

## 2019-05-18 MED ORDER — TIZANIDINE HCL 4 MG PO CAPS: 4.0000 mg | ORAL_CAPSULE | Freq: Every evening | ORAL | 0 refills | Status: DC | PRN

## 2019-05-18 NOTE — Patient Instructions (Signed)
It was nice meeting you today Ms. Marilyn Luna!  Your back pain is due to muscle strain, which will get better with time.  You can continue using Tylenol, Aleve with food, and a heat pad as needed, but I have also sent in a muscle relaxer that may help you sleep.  Please take this before bedtime and not before driving or working.  I hope you feel better soon.  Please do not hesitate to call us if your pain worsens.  If you have any questions or concerns, please feel free to call the clinic.   Be well,  Dr. Shan Levans

## 2019-05-18 NOTE — Assessment & Plan Note (Addendum)
Muscular strain from bending.  Reassured patient that this will resolve with time.  Encouraged her to continue being as active as she can tolerate and using Tylenol with occasional Aleve and ibuprofen for pain.  We will try tizanidine nightly as needed as well.  Cautioned patient not to use this medication at work or before driving.

## 2019-05-18 NOTE — Progress Notes (Signed)
   Subjective:    Marilyn Luna - 38 y.o. female MRN [id number]  Date of birth: Oct 30, 1980  CC:  Marilyn Luna is here for back pain.  HPI: BACK PAIN  Back pain began 11 days ago. Pain is described as dull and located on the L lower back. Patient has tried a heating pad, tylenol, aleve, none of which were very helpful. Pain radiates no. 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  Health Maintenance:  There are no preventive care reminders to display for this patient.  -  reports that she quit smoking about 22 months ago. She has never used smokeless tobacco. - Review of Systems: Per HPI. - Past Medical History: Patient Active Problem List   Diagnosis Date Noted  . Left paraspinal back pain 05/18/2019  . Rash and nonspecific skin eruption 01/05/2018  . Contraceptive management 08/06/2017   - Medications: reviewed and updated   Objective:   Physical Exam BP 100/70   Pulse 82   Wt 173 lb 6.4 oz (78.7 kg)   LMP 05/01/2019 (Exact Date)   SpO2 100%   BMI 26.37 kg/m  Gen: NAD, alert, cooperative with exam, well-appearing Musculoskeletal: Lumbar spine: No visual deformity.  Tender to palpation of the left lumbar paraspinal muscles.  Full ROM, although some tenderness elicited on forward flexion.  Normal gait, neurovascularly intact. Psych: good insight, alert and oriented        Assessment & Plan:   Left paraspinal back pain Muscular strain from bending.  Reassured patient that this will resolve with time.  Encouraged her to continue being as  active as she can tolerate and using Tylenol with occasional Aleve and ibuprofen for pain.  We will try tizanidine nightly as needed as well.  Cautioned patient not to use this medication at work or before driving.    Maia Breslow, M.D. 05/18/2019, 4:54 PM PGY-3, [place]

## 2019-05-26 ENCOUNTER — Other Ambulatory Visit: Payer: Self-pay | Admitting: *Deleted

## 2019-05-26 MED ORDER — TIZANIDINE HCL 4 MG PO TABS: 4.0000 mg | ORAL_TABLET | Freq: Every evening | ORAL | 0 refills | Status: DC | PRN

## 2019-05-26 NOTE — Telephone Encounter (Signed)
Fax received from pharmacy asking for zanaflex capsules to be changed to tablets.  The capsule form is not covered by the insurance.  Will send new script to reflect this change.  Marquie Aderhold,CMA

## 2019-08-02 DIAGNOSIS — Z01419 Encounter for gynecological examination (general) (routine) without abnormal findings: Secondary | ICD-10-CM | POA: Diagnosis not present

## 2019-08-02 DIAGNOSIS — Z6827 Body mass index (BMI) 27.0-27.9, adult: Secondary | ICD-10-CM | POA: Diagnosis not present

## 2019-08-29 ENCOUNTER — Telehealth: Payer: BC Managed Care – PPO | Admitting: Family Medicine

## 2019-08-29 ENCOUNTER — Other Ambulatory Visit: Payer: Self-pay

## 2019-11-08 ENCOUNTER — Other Ambulatory Visit: Payer: Self-pay

## 2019-11-08 ENCOUNTER — Ambulatory Visit
Admission: EM | Admit: 2019-11-08 | Discharge: 2019-11-08 | Disposition: A | Discharge status: Home / Self Care | Payer: BC Managed Care – PPO | Attending: Emergency Medicine | Admitting: Emergency Medicine

## 2019-11-08 DIAGNOSIS — J069 Acute upper respiratory infection, unspecified: Secondary | ICD-10-CM | POA: Insufficient documentation

## 2019-11-08 DIAGNOSIS — R432 Parageusia: Secondary | ICD-10-CM | POA: Insufficient documentation

## 2019-11-08 DIAGNOSIS — R430 Anosmia: Secondary | ICD-10-CM | POA: Insufficient documentation

## 2019-11-08 DIAGNOSIS — Z20818 Contact with and (suspected) exposure to other bacterial communicable diseases: Secondary | ICD-10-CM | POA: Diagnosis not present

## 2019-11-08 LAB — POCT RAPID STREP A (OFFICE): Rapid Strep A Screen: NEGATIVE

## 2019-11-08 MED ORDER — CETIRIZINE HCL 10 MG PO TABS: 10.0000 mg | ORAL_TABLET | Freq: Every day | ORAL | 0 refills | Status: DC

## 2019-11-08 MED ORDER — BENZONATATE 100 MG PO CAPS: 100.0000 mg | ORAL_CAPSULE | Freq: Three times a day (TID) | ORAL | 0 refills | Status: DC

## 2019-11-08 MED ORDER — FLUTICASONE PROPIONATE 50 MCG/ACT NA SUSP: 1.0000 | Freq: Every day | NASAL | 0 refills | Status: DC

## 2019-11-08 NOTE — Discharge Instructions (Signed)

## 2019-11-08 NOTE — ED Provider Notes (Signed)
[facility]    CSN: [id number] Arrival date & time: 11/08/19  5093      History   Chief Complaint Chief Complaint  Patient presents with  . Nasal Congestion    HPI Marilyn Luna is a 39 y.o. female presenting for weeklong course URI symptoms.  Patient endorsing sore throat last week.  Is a Runner, broadcasting/film/video, had strep throat contacts.  States she underwent Covid testing on Monday: Negative.  States Sunday she developed loss of taste and smell.  Did have low-grade temperatures (unknown T-max).  Denies difficulty breathing, chest pain, arthralgias, myalgias.  Not currently take anything for symptoms.   Past Medical History:  Diagnosis Date  . Abnormal Pap smear   . H/O varicella   . Hx of chlamydia infection     Patient Active Problem List   Diagnosis Date Noted  . Left paraspinal back pain 05/18/2019  . Rash and nonspecific skin eruption 01/05/2018  . Contraceptive management 08/06/2017    Past Surgical History:  Procedure Laterality Date  . NO PAST SURGERIES      OB History    Gravida  1   Para  1   Term  1   Preterm  0   AB  0   Living  1     SAB  0   TAB  0   Ectopic  0   Multiple  0   Live Births  1            Home Medications    Prior to Admission medications   Medication Sig Start Date End Date Taking? Authorizing Provider  benzonatate (TESSALON) 100 MG capsule Take 1 capsule (100 mg total) by mouth every 8 (eight) hours. 11/08/19   Hall-Potvin, [place], PA-C  cetirizine (ZYRTEC ALLERGY) 10 MG tablet Take 1 tablet (10 mg total) by mouth daily. 11/08/19   Hall-Potvin, [place], PA-C  fluticasone (FLONASE) 50 MCG/ACT nasal spray Place 1 spray into both nostrils daily. 11/08/19   Hall-Potvin, [place], PA-C    Family History Family History  Problem Relation Age of Onset  . Hypertension Maternal Aunt   . Cancer Paternal Uncle        lung  . Diabetes Maternal Grandmother   . Asthma Brother     Social History Social History    Tobacco Use  . Smoking status: Former Smoker    Quit date: 07/04/2017    Years since quitting: 2.3  . Smokeless tobacco: Never Used  Substance Use Topics  . Alcohol use: Yes    Alcohol/week: 2.0 standard drinks    Types: 2 Glasses of wine per week  . Drug use: No     Allergies   Patient has no known allergies.   Review of Systems As per HPI   Physical Exam Triage Vital Signs ED Triage Vitals  Enc Vitals Group     BP      Pulse      Resp      Temp      Temp src      SpO2      Weight      Height      Head Circumference      Peak Flow      Pain Score      Pain Loc      Pain Edu?      Excl. in GC?    No data found.  Updated Vital Signs BP (!) 159/90 (BP Location: Left Arm)   Pulse  81   Temp 98 F (36.7 C) (Oral)   Resp 18   LMP 10/17/2019   SpO2 97%   Visual Acuity Right Eye Distance:   Left Eye Distance:   Bilateral Distance:    Right Eye Near:   Left Eye Near:    Bilateral Near:     Physical Exam Constitutional:      General: She is not in acute distress.    Appearance: She is obese. She is not ill-appearing or diaphoretic.  HENT:     Head: Normocephalic and atraumatic.     Jaw: There is normal jaw occlusion. No tenderness or pain on movement.     Right Ear: Hearing, tympanic membrane, ear canal and external ear normal. No tenderness. No mastoid tenderness.     Left Ear: Hearing, tympanic membrane, ear canal and external ear normal. No tenderness. No mastoid tenderness.     Nose: No nasal deformity, septal deviation or nasal tenderness.     Right Turbinates: Not swollen or pale.     Left Turbinates: Not swollen or pale.     Right Sinus: No maxillary sinus tenderness or frontal sinus tenderness.     Left Sinus: No maxillary sinus tenderness or frontal sinus tenderness.     Mouth/Throat:     Lips: Pink. No lesions.     Mouth: Mucous membranes are moist. No injury.     Pharynx: Oropharynx is clear. Uvula midline. No oropharyngeal exudate,  posterior oropharyngeal erythema or uvula swelling.     Comments: no tonsillar exudate or hypertrophy Eyes:     General: No scleral icterus.    Conjunctiva/sclera: Conjunctivae normal.     Pupils: Pupils are equal, round, and reactive to light.  Neck:     Comments: Trachea midline, negative JVD Cardiovascular:     Rate and Rhythm: Normal rate and regular rhythm.     Heart sounds: No murmur. No gallop.   Pulmonary:     Effort: Pulmonary effort is normal. No respiratory distress.     Breath sounds: No wheezing, rhonchi or rales.  Musculoskeletal:     Cervical back: Normal range of motion and neck supple. No tenderness. No muscular tenderness.  Lymphadenopathy:     Cervical: No cervical adenopathy.  Skin:    Capillary Refill: Capillary refill takes less than 2 seconds.     Coloration: Skin is not jaundiced or pale.     Findings: No rash.  Neurological:     General: No focal deficit present.     Mental Status: She is alert and oriented to person, place, and time.      UC Treatments / Results  Labs (all labs ordered are listed, but only abnormal results are displayed) Labs Reviewed  POCT RAPID STREP A (OFFICE) - Normal  NOVEL CORONAVIRUS, NAA  CULTURE, GROUP A STREP [facility])    EKG   Radiology No results found.  Procedures Procedures (including critical care time)  Medications Ordered in UC Medications - No data to display  Initial Impression / Assessment and Plan / UC Course  I have reviewed the triage vital signs and the nursing notes.  Pertinent labs & imaging results that were available during my care of the patient were reviewed by me and considered in my medical decision making (see chart for details).     Patient afebrile, nontoxic, with SpO2 97%.  Patient requesting strep testing given exposure-rapid strep negative, culture pending.  Covid PCR pending.  Patient to quarantine until results are back.  We  will treat supportively as outlined below.  Return  precautions discussed, patient verbalized understanding and is agreeable to plan. Final Clinical Impressions(s) / UC Diagnoses   Final diagnoses:  Loss of smell  Loss of taste  URI with cough and congestion  Strep throat exposure     Discharge Instructions     Tessalon for cough. Start flonase, atrovent nasal spray for nasal congestion/drainage. You can use over the counter nasal saline rinse such as neti pot for nasal congestion. Keep hydrated, your urine should be clear to pale yellow in color. Tylenol/motrin for fever and pain. Monitor for any worsening of symptoms, chest pain, shortness of breath, wheezing, swelling of the throat, go to the emergency department for further evaluation needed.     ED Prescriptions    Medication Sig Dispense Auth. Provider   benzonatate (TESSALON) 100 MG capsule Take 1 capsule (100 mg total) by mouth every 8 (eight) hours. 21 capsule Hall-Potvin, [place], PA-C   cetirizine (ZYRTEC ALLERGY) 10 MG tablet Take 1 tablet (10 mg total) by mouth daily. 30 tablet Hall-Potvin, [place], PA-C   fluticasone (FLONASE) 50 MCG/ACT nasal spray Place 1 spray into both nostrils daily. 16 g Hall-Potvin, [place], PA-C     I have reviewed the PDMP during this encounter.   Odette Fraction [place], [place] 11/08/19 [phone]

## 2019-11-08 NOTE — ED Triage Notes (Signed)
Pt c/o sore throat last week, loss of taste/smell on Sunday. States had a neg covid on Monday. Pt c/o runny nose now. States has had her covid vaccines. 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

## 2019-11-09 LAB — NOVEL CORONAVIRUS, NAA: SARS-CoV-2, NAA: NOT DETECTED

## 2019-11-09 LAB — SARS-COV-2, NAA 2 DAY TAT

## 2019-11-10 LAB — CULTURE, GROUP A STREP (THRC)

## 2019-11-21 ENCOUNTER — Ambulatory Visit
Admission: EM | Admit: 2019-11-21 | Discharge: 2019-11-21 | Discharge status: Against Medical Advice | Payer: BC Managed Care – PPO

## 2019-11-21 ENCOUNTER — Other Ambulatory Visit: Payer: Self-pay

## 2019-11-21 NOTE — ED Triage Notes (Signed)
Pt states was here 3 wks ago and had a neg covid and neg strep with URI sx's and loss of taste and smell. States all sx's cleared up except taste and smell.

## 2019-12-28 ENCOUNTER — Ambulatory Visit (INDEPENDENT_AMBULATORY_CARE_PROVIDER_SITE_OTHER): Payer: BC Managed Care – PPO | Admitting: Family Medicine

## 2019-12-28 ENCOUNTER — Other Ambulatory Visit: Payer: Self-pay

## 2019-12-28 VITALS — BP 108/76 | HR 80 | Ht 67.0 in | Wt 170.4 lb

## 2019-12-28 DIAGNOSIS — K649 Unspecified hemorrhoids: Secondary | ICD-10-CM | POA: Diagnosis not present

## 2019-12-28 MED ORDER — HYDROCORTISONE (PERIANAL) 2.5 % EX CREA: 1.0000 "application " | TOPICAL_CREAM | Freq: Two times a day (BID) | CUTANEOUS | 0 refills | Status: DC

## 2019-12-28 NOTE — Progress Notes (Signed)
    SUBJECTIVE:   CHIEF COMPLAINT / HPI:   Marilyn Luna is a 39 yr old female who presents today for concerns for hemorrhoids  Hemorrhoids She has had ongoing symptoms of hemorrhoids since she was a child.  Often experiences painful hemorrhoids which protrude out of the rectum.  She has tried various creams with nitroglycerin, hydrocortisone which helped temporarily but the pain was always returns.  She has a sitz bath at home but has not tried this yet. 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  She had to leave work early yesterday due to pain. This happens on a regular basis and is causing her distress.  Denies constipation and rectal bleeding.  She does have a diet with plenty of food and vegetables and drinks plenty of water too.  PERTINENT  PMH / PSH:  Hemorrhoids   OBJECTIVE:   BP 108/76   Pulse 80   Ht 5\' 7"  (1.702 m)   Wt 170 lb 6.4 oz (77.3 kg)   LMP 12/28/2019   SpO2 98%   BMI 26.69 kg/m   Exam chaperoned by CMA Hannah. No obvious external hemorrhoids, anal fissures.  On anoscopy healthy pink, rectal mucosa, no obvious hemorrhoids visualized.  ASSESSMENT/PLAN:   Hemorrhoids Did not visualize hemorrhoids on examination today however due to patient's longstanding history of pain and recurrent hemorrhoids will refer to general surgery.  Prescribed Anusol cream and recommended she uses this for the itching. She can also continue to use nitroglycerin, witch hazel and sitz bath's.  Recommended that patient follows up with PCP. Pt was happy with the plan.     12/30/2019, MD [facility]

## 2019-12-28 NOTE — Assessment & Plan Note (Signed)
Did not visualize hemorrhoids on examination today however due to patient's longstanding history of pain and recurrent hemorrhoids will refer to general surgery.  Prescribed Anusol cream and recommended she uses this for the itching. She can also continue to use nitroglycerin, witch hazel and sitz bath's.  Recommended that patient follows up with PCP. Pt was happy with the plan.

## 2019-12-28 NOTE — Patient Instructions (Addendum)
Great to see you today. I am sorry the hemorrhoids are causing problems. I am referring you to surgery. In the meantime you can continue using the hydrocortisone cream for the itching. You can also try the sitz bath, witch hazel and anusol cream in the affected area while you wait for the referral.  Please follow up with your PCP Best wishes Dr Allena Katz   Hemorrhoids  Hemorrhoids are swollen veins that may develop:  In the butt (rectum). These are called internal hemorrhoids.  Around the opening of the butt (anus). These are called external hemorrhoids. Hemorrhoids can cause pain, itching, or bleeding. Most of the time, they do not cause serious problems. They usually get better with diet changes, lifestyle changes, and other home treatments. What are the causes? This condition may be caused by:  Having trouble pooping (constipation).  Pushing hard (straining) to poop.  Watery poop (diarrhea).  Pregnancy.  Being very overweight (obese).  Sitting for long periods of time.  Heavy lifting or other activity that causes you to strain.  Anal sex.  Riding a bike for a long period of time. What are the signs or symptoms? Symptoms of this condition include:  Pain.  Itching or soreness in the butt.  Bleeding from the butt.  Leaking poop.  Swelling in the area.  One or more lumps around the opening of your butt. How is this diagnosed? A doctor can often diagnose this condition by looking at the affected area. The doctor may also:  Do an exam that involves feeling the area with a gloved hand (digital rectal exam).  Examine the area inside your butt using a small tube (anoscope).  Order blood tests. This may be done if you have lost a lot of blood.  Have you get a test that involves looking inside the colon using a flexible tube with a camera on the end (sigmoidoscopy or colonoscopy). How is this treated? This condition can usually be treated at home. Your doctor may tell  you to change what you eat, make lifestyle changes, or try home treatments. If these do not help, procedures can be done to remove the hemorrhoids or make them smaller. These may involve:  Placing rubber bands at the base of the hemorrhoids to cut off their blood supply.  Injecting medicine into the hemorrhoids to shrink them.  Shining a type of light energy onto the hemorrhoids to cause them to fall off.  Doing surgery to remove the hemorrhoids or cut off their blood supply. Follow these instructions at home: Eating and drinking   Eat foods that have a lot of fiber in them. These include whole grains, beans, nuts, fruits, and vegetables.  Ask your doctor about taking products that have added fiber (fibersupplements).  Reduce the amount of fat in your diet. You can do this by: ? Eating low-fat dairy products. ? Eating less red meat. ? Avoiding processed foods.  Drink enough fluid to keep your pee (urine) pale yellow. Managing pain and swelling   Take a warm-water bath (sitz bath) for 20 minutes to ease pain. Do this 3-4 times a day. You may do this in a bathtub or using a portable sitz bath that fits over the toilet.  If told, put ice on the painful area. It may be helpful to use ice between your warm baths. ? Put ice in a plastic bag. ? Place a towel between your skin and the bag. ? Leave the ice on for 20 minutes, 2-3 times a  day. General instructions  Take over-the-counter and prescription medicines only as told by your doctor. ? Medicated creams and medicines may be used as told.  Exercise often. Ask your doctor how much and what kind of exercise is best for you.  Go to the bathroom when you have the urge to poop. Do not wait.  Avoid pushing too hard when you poop.  Keep your butt dry and clean. Use wet toilet paper or moist towelettes after pooping.  Do not sit on the toilet for a long time.  Keep all follow-up visits as told by your doctor. This is  important. Contact a doctor if you:  Have pain and swelling that do not get better with treatment or medicine.  Have trouble pooping.  Cannot poop.  Have pain or swelling outside the area of the hemorrhoids. Get help right away if you have:  Bleeding that will not stop. Summary  Hemorrhoids are swollen veins in the butt or around the opening of the butt.  They can cause pain, itching, or bleeding.  Eat foods that have a lot of fiber in them. These include whole grains, beans, nuts, fruits, and vegetables.  Take a warm-water bath (sitz bath) for 20 minutes to ease pain. Do this 3-4 times a day. This information is not intended to replace advice given to you by your health care provider. Make sure you discuss any questions you have with your health care provider. Document Revised: 06/09/2018 Document Reviewed: 10/21/2017 Elsevier Patient Education  [place].

## 2020-01-12 IMAGING — CR DG FOOT COMPLETE 3+V*L*
3 series · 3 of 3 positions shown · non-contrast
Comparison: None.

CLINICAL DATA: Left foot pain

EXAM:
LEFT FOOT - COMPLETE 3+ VIEW

[foot ap]
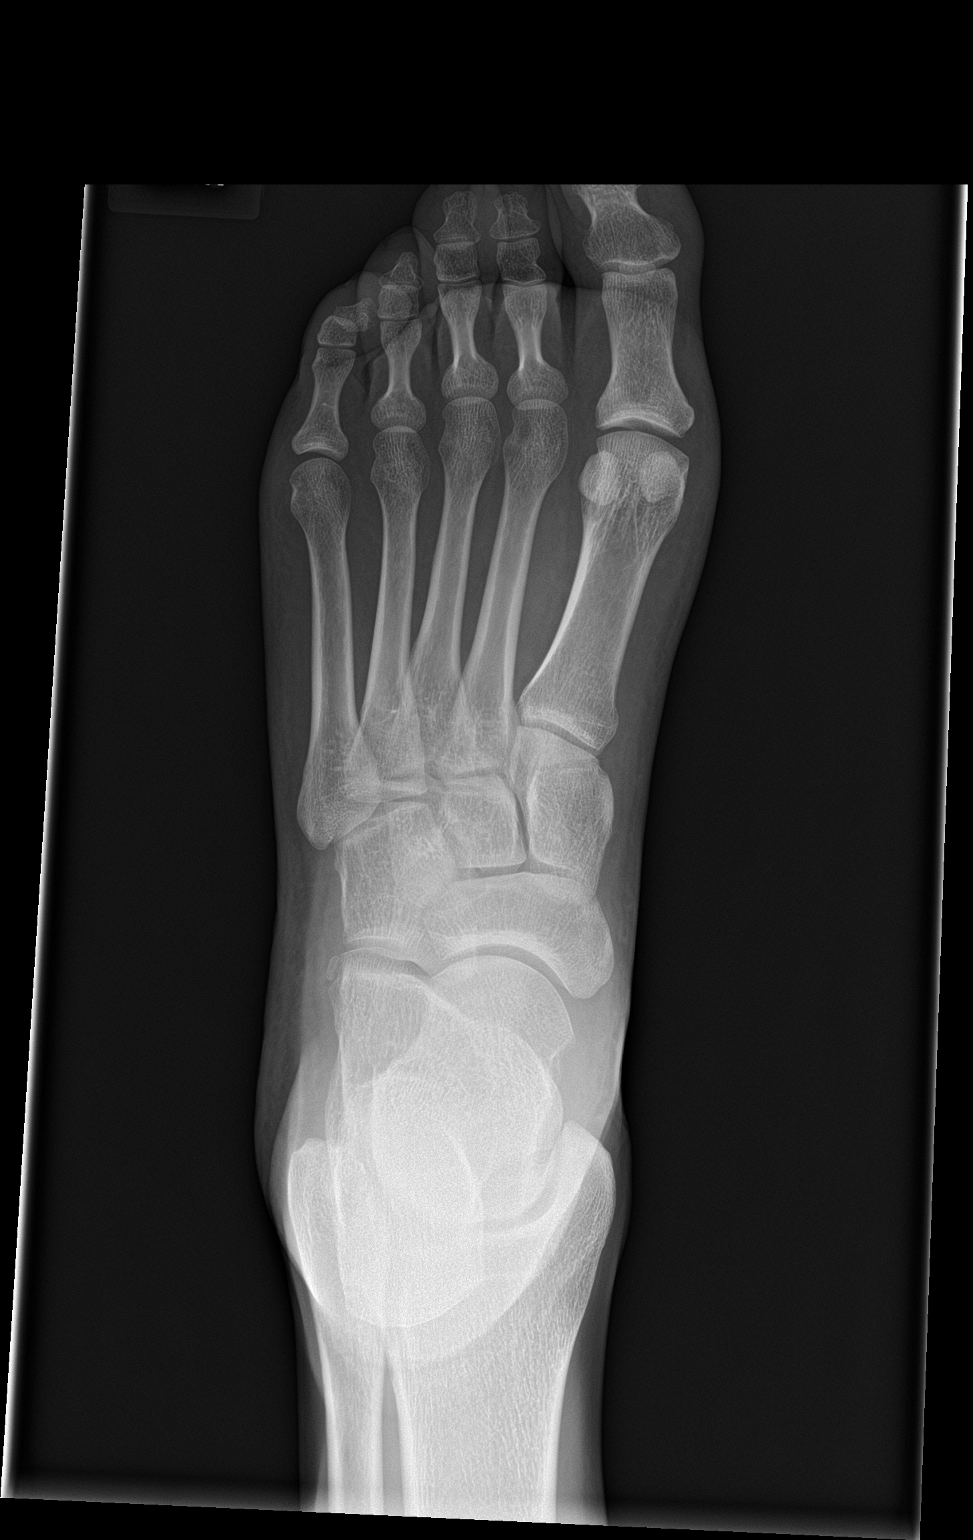

[foot obl]
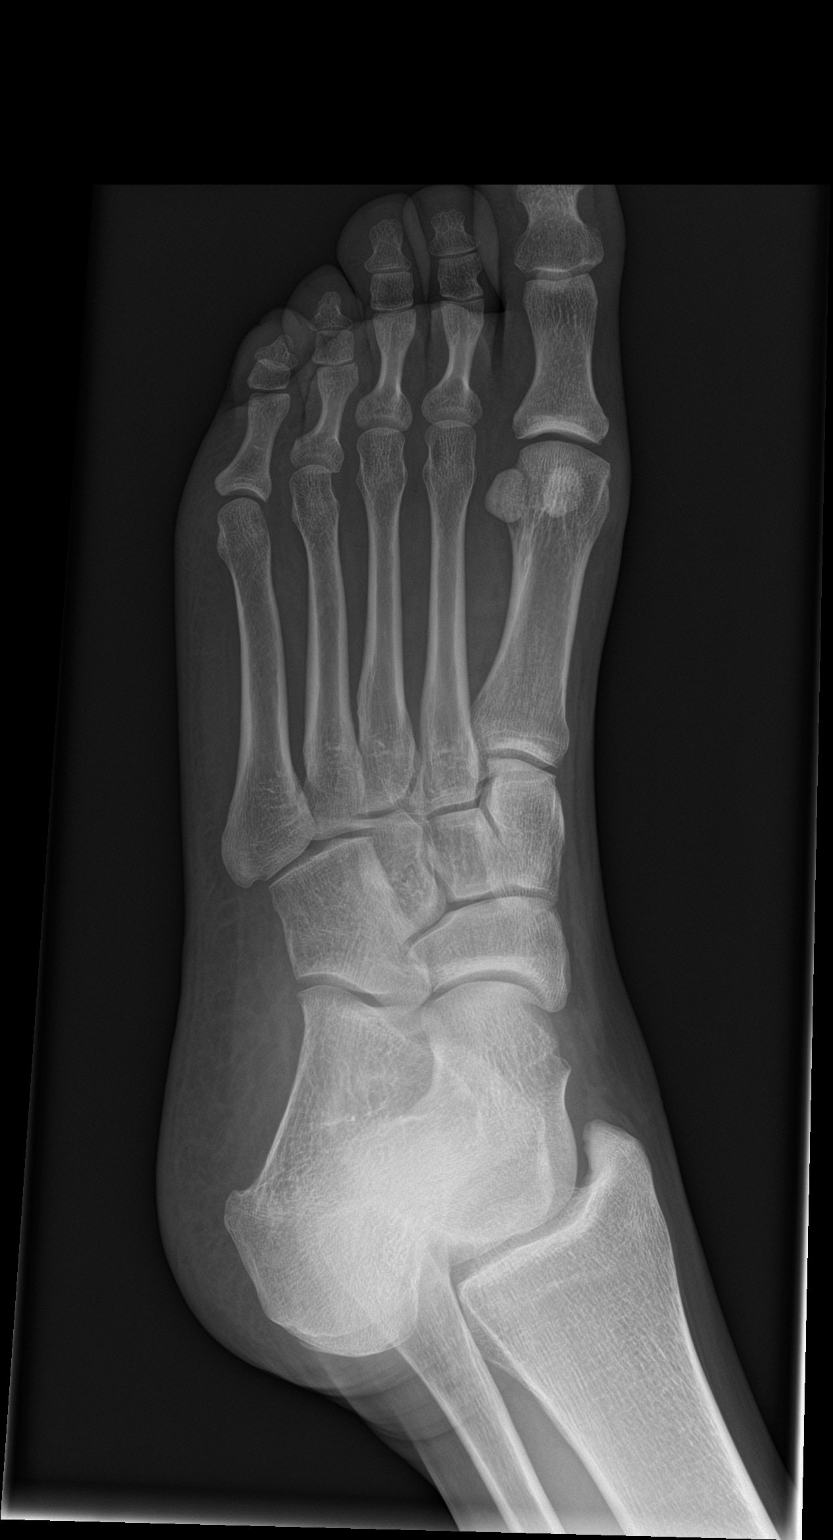

[foot lat]
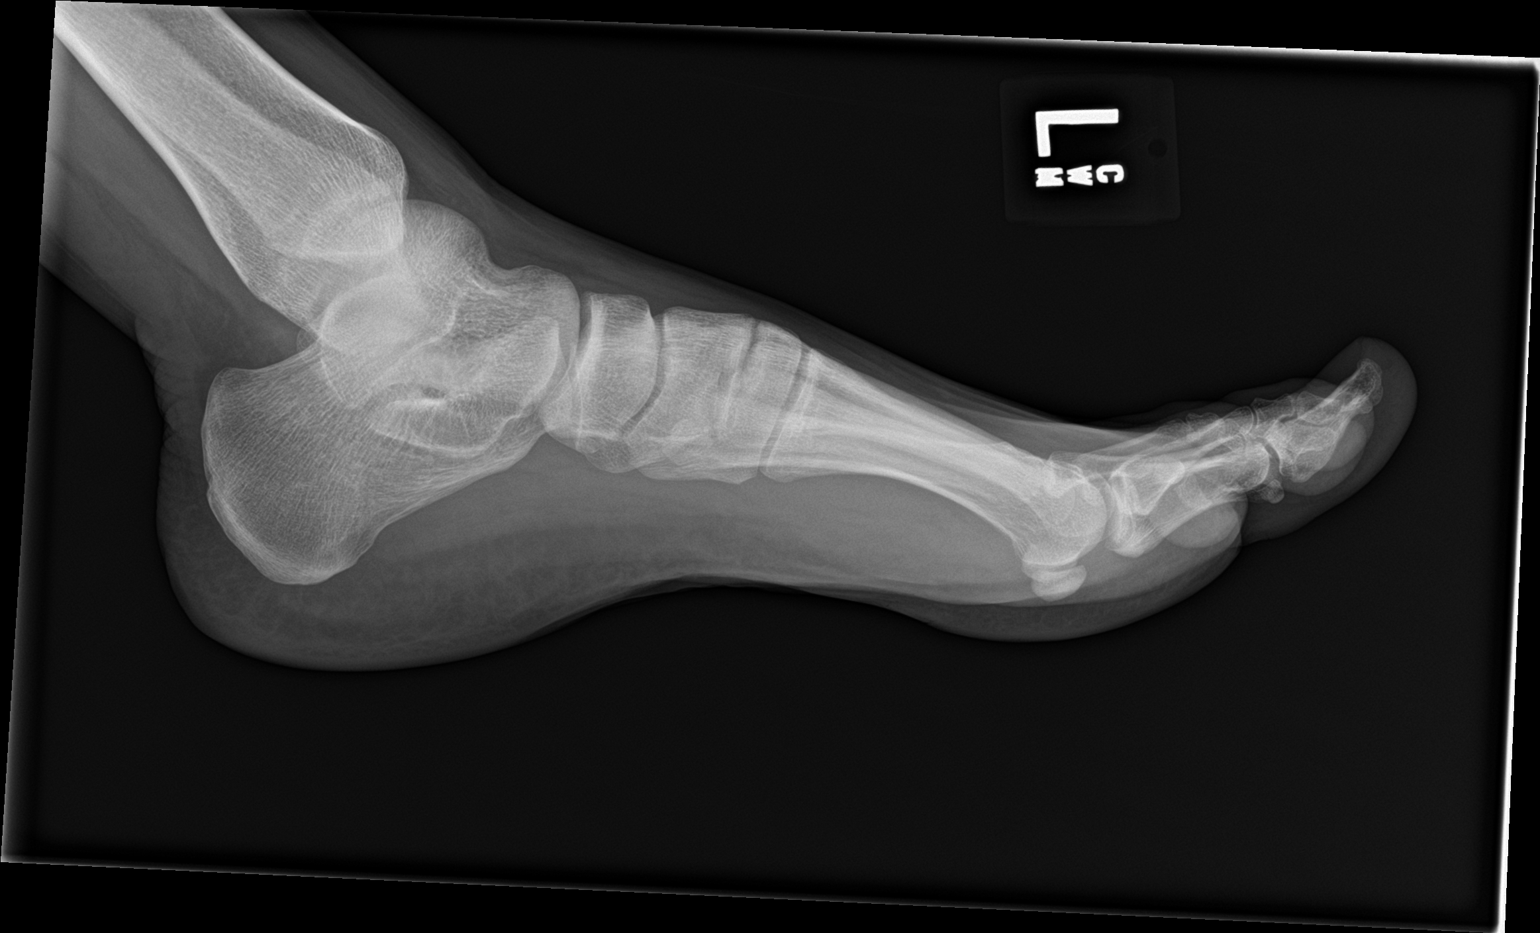

[3 of 3 positions shown; findings below may reference images not displayed]

FINDINGS: There is no evidence of fracture or dislocation. There is no
evidence of arthropathy or other focal bone abnormality. Soft
tissues are unremarkable.
IMPRESSION: Negative.

## 2020-02-07 ENCOUNTER — Ambulatory Visit: Payer: Self-pay | Admitting: General Surgery

## 2020-02-07 DIAGNOSIS — K642 Third degree hemorrhoids: Secondary | ICD-10-CM | POA: Diagnosis not present

## 2020-02-07 NOTE — H&P (Signed)
The patient is a 39 year old female who presents with anal pain. 39 year old female who presents to the office with complaints of hemorrhoids disease. Patient states she has had trouble with constipation and hemorrhoids since she was a child. She has had prolapsing hemorrhoids for many years. She manually reduces these. She has changed her bowel habits and eating habits and now has no further straining. She occasionally has rectal bleeding. This is usually bright red on the toilet paper. She has never had a colonoscopy. 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 ZyrTEC Allergy (10MG  Capsule, Oral) Active. Tylenol (500MG  Capsule, Oral) Active. Medications Reconciled No Current Medications  Social History , MD; 02/07/2020 4:17 PM) Alcohol use Occasional alcohol use. Caffeine use Carbonated beverages, Coffee. No drug use Tobacco use Former smoker.  Family History Romie Levee, MD; 02/07/2020 4:17 PM) Thyroid problems Mother.  Pregnancy / Birth History Romie Levee, MD; 02/07/2020 4:17 PM) Age at menarche 15 years. Contraceptive History Contraceptive implant. Gravida 1 Maternal age 75-30 Para 1 Regular periods  Other Problems 02/09/2020, MD; 02/07/2020 4:17 PM) Asthma Hemorrhoids     Review of Systems Romie Levee MD;  02/07/2020 4:17 PM) General Present- Night Sweats. Not Present- Appetite Loss, Chills, Fatigue, Fever, Weight Gain and Weight Loss. Skin Present- Hives. Not Present- Change in Wart/Mole, Dryness, Jaundice, New Lesions, Non-Healing Wounds, Rash and Ulcer. HEENT Present- Hoarseness and Wears glasses/contact lenses. Not Present- Earache, Hearing Loss, Nose Bleed, Oral Ulcers, Ringing in the Ears, Seasonal Allergies, Sinus Pain, Sore Throat, Visual Disturbances and Yellow Eyes. Respiratory Not Present- Bloody sputum, Chronic Cough, Difficulty Breathing, Snoring and Wheezing. Breast Not Present- Breast Mass, Breast Pain, Nipple Discharge and Skin Changes. Cardiovascular Not Present- Chest Pain, Difficulty Breathing Lying Down, Leg Cramps, Palpitations, Rapid Heart Rate, Shortness of Breath and Swelling of Extremities. Gastrointestinal Present- Hemorrhoids. Not Present- Abdominal Pain, Bloating, Bloody Stool, Change in Bowel Habits, Chronic diarrhea, Constipation, Difficulty Swallowing, Excessive gas, Gets full quickly at meals, Indigestion, Nausea, Rectal Pain and Vomiting. Female Genitourinary Not Present- Frequency, Nocturia, Painful Urination, Pelvic Pain and Urgency. Musculoskeletal Not Present- Back Pain, Joint Pain, Joint Stiffness, Muscle Pain, Muscle Weakness and Swelling of Extremities. Neurological Not Present- Decreased Memory, Fainting, Headaches, Numbness, Seizures, Tingling, Tremor, Trouble walking and Weakness. Psychiatric Not Present- Anxiety, Bipolar, Change in Sleep Pattern, Depression, Fearful and Frequent crying. Endocrine Not Present- Cold Intolerance, Excessive Hunger, Hair Changes, Heat Intolerance, Hot flashes and New Diabetes. Hematology Not Present- Blood Thinners, Easy Bruising, Excessive bleeding, Gland problems, HIV and Persistent Infections.  Vitals Romie Levee Henderson RMA; 02/07/2020 3:59 PM) 02/07/2020 3:59 PM Weight: 174.38 lb Height: 67in Body Surface Area: 1.91 m Body  Mass Index: 27.31 kg/m  Temp.: 98.61F  Pulse: 90 (Regular)  P.OX: 98% (Room air) BP: 102/80(Sitting, Left Arm, Standard)        Physical Exam 02/09/2020 MD; 02/07/2020 4:18 PM)  General Mental Status-Alert. General Appearance-Cooperative.  Rectal Anorectal Exam External - skin tag and ulcerated. Internal - normal sphincter tone.  Assessment & Plan Romie Levee MD; 02/07/2020 4:15 PM)  PROLAPSED INTERNAL HEMORRHOIDS, GRADE 3 (Y05.1) Impression: 39 year old female who presents to the office for evaluation of hemorrhoid disease. She states that this is been present since childhood. On exam today, she has to grade 3 hemorrhoids in the right posterior and anterior positions. There is associated edematous external skin tags. I do not think she would be a good rubber band ligation candidate due to the significant external disease. She has tried dietary modifications changing her bowel habits. She denies any constipation or straining. She continues to have symptoms on at least a weekly basis. We discussed trans-hemorrhoidal dearterialization versus standard hemorrhoidectomy. She is elected to proceed with standard hemorrhoidectomy. She understands that she will have quite a bit of postoperative pain and this could continue for several months after surgery. I have recommended that she stay out of work for at least 1 month.

## 2020-03-23 ENCOUNTER — Other Ambulatory Visit (HOSPITAL_COMMUNITY)
Admission: RE | Admit: 2020-03-23 | Discharge: 2020-03-23 | Disposition: A | Discharge status: Home / Self Care | Payer: BC Managed Care – PPO | Source: Ambulatory Visit | Attending: General Surgery | Admitting: General Surgery

## 2020-03-23 DIAGNOSIS — Z20822 Contact with and (suspected) exposure to covid-19: Secondary | ICD-10-CM | POA: Insufficient documentation

## 2020-03-23 DIAGNOSIS — Z01812 Encounter for preprocedural laboratory examination: Secondary | ICD-10-CM | POA: Insufficient documentation

## 2020-03-23 LAB — SARS CORONAVIRUS 2 (TAT 6-24 HRS): SARS Coronavirus 2: NEGATIVE

## 2020-03-25 ENCOUNTER — Encounter (HOSPITAL_BASED_OUTPATIENT_CLINIC_OR_DEPARTMENT_OTHER): Payer: Self-pay | Admitting: General Surgery

## 2020-03-25 ENCOUNTER — Other Ambulatory Visit: Payer: Self-pay

## 2020-03-25 NOTE — Progress Notes (Addendum)
Addendum: spoke with jessica zanetto pa, pt meets [facility] guidelines   Spoke w/ via phone for pre-op interview---pt Lab needs dos----  Urine poct , ekg            Lab results------none COVID test ------03-23-2020 Arrive at -------1230 pm 03-27-2020 NPO after MN NO Solid Food.  Clear liquids from MN until---1130pm then npo Medications to take morning of surgery -----none Diabetic medication -----n/a Patient Special Instructions -----none Pre-Op special Istructions -----none Patient verbalized understanding of instructions that were given at this phone interview. Patient denies shortness of breath, chest pain, fever, cough at this phone interview.  Anesthesia : patient reports waking up one night last week with chest pain, not sure how long it lasted, and chest pain 1 time the week before that not sure how long lasted, did not seek medical attention or see anyone for chest pain  Chart to jessica zanetto pa for review  PCP: dr Percival Spanish lilland has not seen recently Cardiologist: none Chest x-ray :none EKG :none Echo :none Stress test:none Cardiac Cath : none Activity level: normal activity level, no trouble with climbing steps no doe , does own housework  Sleep Study/ CPAP :none Fasting Blood Sugar :      / Checks Blood Sugar -- times a day:  n/a Blood Thinner/ Instructions /Last Dose:n/a ASA / Instructions/ Last Dose : na

## 2020-03-26 MED ORDER — BUPIVACAINE LIPOSOME 1.3 % IJ SUSP: 20.0000 mL | Freq: Once | INTRAMUSCULAR | Status: DC

## 2020-03-27 ENCOUNTER — Ambulatory Visit (HOSPITAL_BASED_OUTPATIENT_CLINIC_OR_DEPARTMENT_OTHER): Payer: BC Managed Care – PPO | Admitting: Physician Assistant

## 2020-03-27 ENCOUNTER — Ambulatory Visit (HOSPITAL_BASED_OUTPATIENT_CLINIC_OR_DEPARTMENT_OTHER)
Admission: RE | Admit: 2020-03-27 | Discharge: 2020-03-27 | Disposition: A | Discharge status: Home / Self Care | Payer: BC Managed Care – PPO | Attending: General Surgery | Admitting: General Surgery

## 2020-03-27 ENCOUNTER — Encounter (HOSPITAL_BASED_OUTPATIENT_CLINIC_OR_DEPARTMENT_OTHER): Payer: Self-pay | Admitting: General Surgery

## 2020-03-27 ENCOUNTER — Encounter (HOSPITAL_BASED_OUTPATIENT_CLINIC_OR_DEPARTMENT_OTHER)
Admission: RE | Disposition: A | Discharge status: Home / Self Care | Payer: Self-pay | Source: Home / Self Care | Attending: General Surgery

## 2020-03-27 DIAGNOSIS — Z79899 Other long term (current) drug therapy: Secondary | ICD-10-CM | POA: Insufficient documentation

## 2020-03-27 DIAGNOSIS — K644 Residual hemorrhoidal skin tags: Secondary | ICD-10-CM | POA: Diagnosis not present

## 2020-03-27 DIAGNOSIS — I517 Cardiomegaly: Secondary | ICD-10-CM | POA: Diagnosis not present

## 2020-03-27 DIAGNOSIS — J45909 Unspecified asthma, uncomplicated: Secondary | ICD-10-CM | POA: Diagnosis not present

## 2020-03-27 DIAGNOSIS — K642 Third degree hemorrhoids: Secondary | ICD-10-CM | POA: Diagnosis not present

## 2020-03-27 DIAGNOSIS — Z8349 Family history of other endocrine, nutritional and metabolic diseases: Secondary | ICD-10-CM | POA: Diagnosis not present

## 2020-03-27 DIAGNOSIS — Z87891 Personal history of nicotine dependence: Secondary | ICD-10-CM | POA: Diagnosis not present

## 2020-03-27 DIAGNOSIS — L918 Other hypertrophic disorders of the skin: Secondary | ICD-10-CM | POA: Diagnosis not present

## 2020-03-27 DIAGNOSIS — K648 Other hemorrhoids: Secondary | ICD-10-CM | POA: Diagnosis not present

## 2020-03-27 HISTORY — DX: Chest pain, unspecified: R07.9

## 2020-03-27 HISTORY — PX: HEMORRHOID SURGERY: SHX153

## 2020-03-27 HISTORY — DX: Unspecified hemorrhoids: K64.9

## 2020-03-27 LAB — POCT PREGNANCY, URINE: Preg Test, Ur: NEGATIVE

## 2020-03-27 SURGERY — HEMORRHOIDECTOMY
Anesthesia: Monitor Anesthesia Care | Site: Anus

## 2020-03-27 MED ORDER — DEXAMETHASONE SODIUM PHOSPHATE 4 MG/ML IJ SOLN
INTRAMUSCULAR | Status: DC | PRN
  Administered 2020-03-27: 5 mg via INTRAVENOUS

## 2020-03-27 MED ORDER — DEXMEDETOMIDINE HCL 200 MCG/2ML IV SOLN
INTRAVENOUS | Status: DC | PRN
  Administered 2020-03-27 (×2): 4 ug via INTRAVENOUS

## 2020-03-27 MED ORDER — SODIUM CHLORIDE 0.9% FLUSH: 3.0000 mL | Freq: Two times a day (BID) | INTRAVENOUS | Status: DC

## 2020-03-27 MED ORDER — MIDAZOLAM HCL 2 MG/2ML IJ SOLN
INTRAMUSCULAR | Status: AC
  Filled 2020-03-27: qty 2

## 2020-03-27 MED ORDER — LIDOCAINE 5 % EX OINT
TOPICAL_OINTMENT | CUTANEOUS | Status: AC
  Filled 2020-03-27: qty 35.44

## 2020-03-27 MED ORDER — ACETAMINOPHEN 500 MG PO TABS
1000.0000 mg | ORAL_TABLET | ORAL | Status: AC
  Administered 2020-03-27: 1000 mg via ORAL

## 2020-03-27 MED ORDER — OXYCODONE HCL 5 MG PO TABS: 5.0000 mg | ORAL_TABLET | Freq: Once | ORAL | Status: DC | PRN

## 2020-03-27 MED ORDER — BUPIVACAINE-EPINEPHRINE 0.5% -1:200000 IJ SOLN
INTRAMUSCULAR | Status: AC
  Filled 2020-03-27: qty 1

## 2020-03-27 MED ORDER — ONDANSETRON HCL 4 MG/2ML IJ SOLN
INTRAMUSCULAR | Status: DC | PRN
  Administered 2020-03-27: 4 mg via INTRAVENOUS

## 2020-03-27 MED ORDER — OXYCODONE HCL 5 MG/5ML PO SOLN: 5.0000 mg | Freq: Once | ORAL | Status: DC | PRN

## 2020-03-27 MED ORDER — LIDOCAINE HCL (CARDIAC) PF 100 MG/5ML IV SOSY
PREFILLED_SYRINGE | INTRAVENOUS | Status: DC | PRN
  Administered 2020-03-27: 60 mg via INTRAVENOUS

## 2020-03-27 MED ORDER — PROPOFOL 500 MG/50ML IV EMUL
INTRAVENOUS | Status: DC | PRN
  Administered 2020-03-27: 50 ug/kg/min via INTRAVENOUS

## 2020-03-27 MED ORDER — MIDAZOLAM HCL 5 MG/5ML IJ SOLN
INTRAMUSCULAR | Status: DC | PRN
  Administered 2020-03-27: 2 mg via INTRAVENOUS
  Administered 2020-03-27 (×2): 1 mg via INTRAVENOUS

## 2020-03-27 MED ORDER — FENTANYL CITRATE (PF) 100 MCG/2ML IJ SOLN: 25.0000 ug | INTRAMUSCULAR | Status: DC | PRN

## 2020-03-27 MED ORDER — DEXAMETHASONE SODIUM PHOSPHATE 10 MG/ML IJ SOLN
INTRAMUSCULAR | Status: AC
  Filled 2020-03-27: qty 1

## 2020-03-27 MED ORDER — GABAPENTIN 300 MG PO CAPS
300.0000 mg | ORAL_CAPSULE | ORAL | Status: AC
  Administered 2020-03-27: 300 mg via ORAL

## 2020-03-27 MED ORDER — BUPIVACAINE-EPINEPHRINE 0.5% -1:200000 IJ SOLN
INTRAMUSCULAR | Status: DC | PRN
  Administered 2020-03-27: 30 mL

## 2020-03-27 MED ORDER — DEXMEDETOMIDINE (PRECEDEX) IN NS 20 MCG/5ML (4 MCG/ML) IV SYRINGE
PREFILLED_SYRINGE | INTRAVENOUS | Status: AC
  Filled 2020-03-27: qty 5

## 2020-03-27 MED ORDER — LACTATED RINGERS IV SOLN: INTRAVENOUS | Status: DC

## 2020-03-27 MED ORDER — BUPIVACAINE LIPOSOME 1.3 % IJ SUSP
INTRAMUSCULAR | Status: DC | PRN
  Administered 2020-03-27: 20 mL

## 2020-03-27 MED ORDER — DIAZEPAM 5 MG PO TABS: 5.0000 mg | ORAL_TABLET | Freq: Three times a day (TID) | ORAL | 0 refills | Status: AC | PRN

## 2020-03-27 MED ORDER — LIDOCAINE 5 % EX OINT
TOPICAL_OINTMENT | CUTANEOUS | Status: DC | PRN
  Administered 2020-03-27: 1

## 2020-03-27 MED ORDER — PROPOFOL 500 MG/50ML IV EMUL
INTRAVENOUS | Status: AC
  Filled 2020-03-27: qty 50

## 2020-03-27 MED ORDER — LIDOCAINE 2% (20 MG/ML) 5 ML SYRINGE
INTRAMUSCULAR | Status: AC
  Filled 2020-03-27: qty 5

## 2020-03-27 MED ORDER — OXYCODONE HCL 5 MG PO TABS: 5.0000 mg | ORAL_TABLET | Freq: Four times a day (QID) | ORAL | 0 refills | Status: DC | PRN

## 2020-03-27 MED ORDER — CELECOXIB 200 MG PO CAPS
200.0000 mg | ORAL_CAPSULE | ORAL | Status: AC
  Administered 2020-03-27: 200 mg via ORAL

## 2020-03-27 MED ORDER — BUPIVACAINE LIPOSOME 1.3 % IJ SUSP
INTRAMUSCULAR | Status: AC
  Filled 2020-03-27: qty 20

## 2020-03-27 MED ORDER — ACETAMINOPHEN 500 MG PO TABS
ORAL_TABLET | ORAL | Status: AC
  Filled 2020-03-27: qty 2

## 2020-03-27 MED ORDER — ONDANSETRON HCL 4 MG/2ML IJ SOLN
INTRAMUSCULAR | Status: AC
  Filled 2020-03-27: qty 2

## 2020-03-27 MED ORDER — AMISULPRIDE (ANTIEMETIC) 5 MG/2ML IV SOLN: 10.0000 mg | Freq: Once | INTRAVENOUS | Status: DC | PRN

## 2020-03-27 MED ORDER — FENTANYL CITRATE (PF) 100 MCG/2ML IJ SOLN
INTRAMUSCULAR | Status: DC | PRN
Start: 2020-03-27 — End: 2020-03-27
  Administered 2020-03-27 (×4): 25 ug via INTRAVENOUS

## 2020-03-27 MED ORDER — ONDANSETRON HCL 4 MG/2ML IJ SOLN: 4.0000 mg | Freq: Once | INTRAMUSCULAR | Status: DC | PRN

## 2020-03-27 MED ORDER — CELECOXIB 200 MG PO CAPS
ORAL_CAPSULE | ORAL | Status: AC
  Filled 2020-03-27: qty 1

## 2020-03-27 MED ORDER — FENTANYL CITRATE (PF) 100 MCG/2ML IJ SOLN
INTRAMUSCULAR | Status: AC
  Filled 2020-03-27: qty 2

## 2020-03-27 MED ORDER — GABAPENTIN 300 MG PO CAPS
ORAL_CAPSULE | ORAL | Status: AC
  Filled 2020-03-27: qty 1

## 2020-03-27 MED ORDER — KETOROLAC TROMETHAMINE 30 MG/ML IJ SOLN
INTRAMUSCULAR | Status: DC | PRN
  Administered 2020-03-27: 30 mg via INTRAVENOUS

## 2020-03-27 SURGICAL SUPPLY — 36 items
BLADE EXTENDED COATED 6.5IN (ELECTRODE) IMPLANT
BLADE HEX COATED 2.75 (ELECTRODE) ×2 IMPLANT
BLADE SURG 10 STRL SS (BLADE) IMPLANT
BRIEF STRETCH FOR OB PAD LRG (UNDERPADS AND DIAPERS) ×1 IMPLANT
COVER BACK TABLE 60X90IN (DRAPES) ×2 IMPLANT
COVER MAYO STAND STRL (DRAPES) ×2 IMPLANT
COVER WAND RF STERILE (DRAPES) ×1 IMPLANT
DRAPE LAPAROTOMY 100X72 PEDS (DRAPES) ×2 IMPLANT
DRAPE UTILITY XL STRL (DRAPES) ×2 IMPLANT
DRSG PAD ABDOMINAL 8X10 ST (GAUZE/BANDAGES/DRESSINGS) ×2 IMPLANT
ELECT REM PT RETURN 9FT ADLT (ELECTROSURGICAL) ×2
ELECTRODE REM PT RTRN 9FT ADLT (ELECTROSURGICAL) ×1 IMPLANT
GAUZE SPONGE 4X4 12PLY STRL (GAUZE/BANDAGES/DRESSINGS) ×1 IMPLANT
GLOVE BIO SURGEON STRL SZ 6.5 (GLOVE) ×2 IMPLANT
GLOVE BIOGEL PI IND STRL 7.0 (GLOVE) ×1 IMPLANT
GLOVE BIOGEL PI INDICATOR 7.0 (GLOVE) ×1
KIT SIGMOIDOSCOPE (SET/KITS/TRAYS/PACK) IMPLANT
KIT TURNOVER CYSTO (KITS) ×2 IMPLANT
NEEDLE HYPO 22GX1.5 SAFETY (NEEDLE) ×2 IMPLANT
NS IRRIG 500ML POUR BTL (IV SOLUTION) ×2 IMPLANT
PACK BASIN DAY SURGERY FS (CUSTOM PROCEDURE TRAY) ×2 IMPLANT
PAD ARMBOARD 7.5X6 YLW CONV (MISCELLANEOUS) IMPLANT
PENCIL SMOKE EVACUATOR (MISCELLANEOUS) ×2 IMPLANT
SPONGE SURGIFOAM ABS GEL 100 (HEMOSTASIS) IMPLANT
SPONGE SURGIFOAM ABS GEL 12-7 (HEMOSTASIS) IMPLANT
SUT CHROMIC 2 0 SH (SUTURE) ×2 IMPLANT
SUT CHROMIC 3 0 SH 27 (SUTURE) ×2 IMPLANT
SUT VIC AB 2-0 SH 27 (SUTURE)
SUT VIC AB 2-0 SH 27XBRD (SUTURE) IMPLANT
SUT VIC AB 4-0 SH 18 (SUTURE) IMPLANT
SYR CONTROL 10ML LL (SYRINGE) ×2 IMPLANT
TOWEL OR 17X26 10 PK STRL BLUE (TOWEL DISPOSABLE) ×2 IMPLANT
TRAY DSU PREP LF (CUSTOM PROCEDURE TRAY) ×2 IMPLANT
TUBE CONNECTING 12X1/4 (SUCTIONS) ×2 IMPLANT
WATER STERILE IRR 500ML POUR (IV SOLUTION) IMPLANT
YANKAUER SUCT BULB TIP NO VENT (SUCTIONS) ×2 IMPLANT

## 2020-03-27 NOTE — Discharge Instructions (Addendum)
Post Anesthesia Home Care Instructions  Activity: Get plenty of rest for the remainder of the day. A responsible adult should stay with you for 24 hours following the procedure.  For the next 24 hours, DO NOT: -Drive a car -Advertising copywriter -Drink alcoholic beverages -Take any medication unless instructed by your physician -Make any legal decisions or sign important papers.  Meals: Start with liquid foods such as gelatin or soup. Progress to regular foods as tolerated. Avoid greasy, spicy, heavy foods. If nausea and/or vomiting occur, drink only clear liquids until the nausea and/or vomiting subsides. Call your physician if vomiting continues.  Special Instructions/Symptoms: Your throat may feel dry or sore from the anesthesia or the breathing tube placed in your throat during surgery. If this causes discomfort, gargle with warm salt water. The discomfort should disappear within 24 hours.  If you had a scopolamine patch placed behind your ear for the management of post- operative nausea and/or vomiting:  1. The medication in the patch is effective for 72 hours, after which it should be removed.  Wrap patch in a tissue and discard in the trash. Wash hands thoroughly with soap and water. 2. You may remove the patch earlier than 72 hours if you experience unpleasant side effects which may include dry mouth, dizziness or visual disturbances. 3. Avoid touching the patch. Wash your hands with soap and water after contact with the patch.   ANORECTAL SURGERY: POST OP INSTRUCTIONS 1. Take your usually prescribed home medications unless otherwise directed. 2. DIET: During the first few hours after surgery sip on some liquids until you are able to urinate.  It is normal to not urinate for several hours after this surgery.  If you feel uncomfortable, please contact the office for instructions.  After you are able to urinate,you may eat, if you feel like it.  Follow a light bland diet the first 24  hours after arrival home, such as soup, liquids, crackers, etc.  Be sure to include lots of fluids daily (6-8 glasses).  Avoid fast food or heavy meals, as your are more likely to get nauseated.  Eat a low fat diet the next few days after surgery.  Limit caffeine intake to 1-2 servings a day. 3. PAIN CONTROL: a. Pain is best controlled by a usual combination of several different methods TOGETHER: i. Muscle relaxation 1.  Soak in a warm bath (or Sitz bath) three times a day and after bowel movements.  Continue to do this until all pain is resolved. 2. Take the muscle relaxer (Valium) every 6 hours for the first 2 days after surgery  ii. Over the counter pain medication iii. Prescription pain medication b. Most patients will experience some swelling and discomfort in the anus/rectal area and incisions.  Heat such as warm towels, sitz baths, warm baths, etc to help relax tight/sore spots and speed recovery.  Some people prefer to use ice, especially in the first couple days after surgery, as it may decrease the pain and swelling, or alternate between ice & heat.  Experiment to what works for you.  Swelling and bruising can take several weeks to resolve.  Pain can take even longer to completely resolve. c. It is helpful to take an over-the-counter pain medication regularly for the first few weeks.  Choose one of the following that works best for you: i. Naproxen (Aleve, etc)  Two 220mg  tabs twice a day ii. Ibuprofen (Advil, etc) Three 200mg  tabs four times a day (every meal & bedtime)  d. A  prescription for pain medication (such as percocet, oxycodone, hydrocodone, etc) should be given to you upon discharge.  Take your pain medication as prescribed.  i. If you are having problems/concerns with the prescription medicine (does not control pain, nausea, vomiting, rash, itching, etc), please call us [phone] to see if we need to switch you to a different pain medicine that will work better for you and/or  control your side effect better. ii. If you need a refill on your pain medication, please contact your pharmacy.  They will contact our office to request authorization. Prescriptions will not be filled after 5 pm or on week-ends. 4. KEEP YOUR BOWELS REGULAR and AVOID CONSTIPATION a. The goal is one to two soft bowel movements a day.  You should at least have a bowel movement every other day. b. Avoid getting constipated.  Between the surgery and the pain medications, it is common to experience some constipation. This can be very painful after rectal surgery.  Increasing fluid intake and taking a fiber supplement (such as Metamucil, Citrucel, FiberCon, etc) 1-2 times a day regularly will usually help prevent this problem from occurring.  A stool softener like colace is also recommended.  This can be purchased over the counter at your pharmacy.  You can take it up to 3 times a day.  If you do not have a bowel movement after 24 hrs since your surgery, take one does of milk of magnesia.  If you still haven't had a bowel movement 8-12 hours after that dose, take another dose.  If you don't have a bowel movement 48 hrs after surgery, purchase a Fleets enema from the drug store and administer gently per package instructions.  If you still are having trouble with your bowel movements after that, please call the office for further instructions. c. If you develop diarrhea or have many loose bowel movements, simplify your diet to bland foods & liquids for a few days.  Stop any stool softeners and decrease your fiber supplement.  Switching to mild anti-diarrheal medications (Kayopectate, Pepto Bismol) can help.  If this worsens or does not improve, please call us.  5. Wound Care a. Remove your bandages before your first bowel movement or 8 hours after surgery.     b. Remove any wound packing material at this tim,e as well.  You do not need to repack the wound unless instructed otherwise.  Wear an absorbent pad or soft  cotton gauze in your underwear to catch any drainage and help keep the area clean. You should change this every 2-3 hours while awake. c. Keep the area clean and dry.  Bathe / shower every day, especially after bowel movements.  Keep the area clean by showering / bathing over the incision / wound.   It is okay to soak an open wound to help wash it.  Wet wipes or showers / gentle washing after bowel movements is often less traumatic than regular toilet paper. d. Bonita Quin may have some styrofoam-like soft packing in the rectum which will come out with the first bowel movement.  e. You will often notice bleeding with bowel movements.  This should slow down by the end of the first week of surgery f. Expect some drainage.  This should slow down, too, by the end of the first week of surgery.  Wear an absorbent pad or soft cotton gauze in your underwear until the drainage stops. g. Do Not sit on a rubber or pillow ring.  This can make you symptoms worse.  You may sit on a soft pillow if needed.  6. ACTIVITIES as tolerated:   a. You may resume regular (light) daily activities beginning the next day--such as daily self-care, walking, climbing stairs--gradually increasing activities as tolerated.  If you can walk 30 minutes without difficulty, it is safe to try more intense activity such as jogging, treadmill, bicycling, low-impact aerobics, swimming, etc. b. Save the most intensive and strenuous activity for last such as sit-ups, heavy lifting, contact sports, etc  Refrain from any heavy lifting or straining until you are off narcotics for pain control.   c. You may drive when you are no longer taking prescription pain medication, you can comfortably sit for long periods of time, and you can safely maneuver your car and apply brakes. d. Bonita Quin may have sexual intercourse when it is comfortable.  7. FOLLOW UP in our office a. Please call [facility] at [phone] to set up an appointment to see your surgeon in the office for  a follow-up appointment approximately 3-4 weeks after your surgery. b. Make sure that you call for this appointment the day you arrive home to insure a convenient appointment time. 10. IF YOU HAVE DISABILITY OR FAMILY LEAVE FORMS, BRING THEM TO THE OFFICE FOR PROCESSING.  DO NOT GIVE THEM TO YOUR DOCTOR.     WHEN TO CALL us [phone]: 1. Poor pain control 2. Reactions / problems with new medications (rash/itching, nausea, etc)  3. Fever over 101.5 F (38.5 C) 4. Inability to urinate 5. Nausea and/or vomiting 6. Worsening swelling or bruising 7. Continued bleeding from incision. 8. Increased pain, redness, or drainage from the incision  The clinic staff is available to answer your questions during regular business hours (8:30am-5pm).  Please don't hesitate to call and ask to speak to one of our nurses for clinical concerns.   A surgeon from [facility] is always on call at the hospitals   If you have a medical emergency, go to the nearest emergency room or call 911.    [facility] [address], [place], [place]  [address] ? MAIN: [phone] ? TOLL FREE: [phone] ? FAX [phone] [email or web address]  Information for Discharge Teaching: EXPAREL (bupivacaine liposome injectable suspension)   Your surgeon gave you EXPAREL(bupivacaine) in your surgical incision to help control your pain after surgery.   EXPAREL is a local anesthetic that provides pain relief by numbing the tissue around the surgical site.  EXPAREL is designed to release pain medication over time and can control pain for up to 72 hours.  Depending on how you respond to EXPAREL, you may require less pain medication during your recovery.  Possible side effects:  Temporary loss of sensation or ability to move in the area where bupivacaine was injected.  Nausea, vomiting, constipation  Rarely, numbness and tingling in your mouth or lips,  lightheadedness, or anxiety may occur.  Call your doctor right away if you think you may be experiencing any of these sensations, or if you have other questions regarding possible side effects.  Follow all other discharge instructions given to you by your surgeon or nurse. Eat a healthy diet and drink plenty of water or other fluids.  If you return to the hospital for any reason within 96 hours following the administration of EXPAREL, please inform your health care providers.

## 2020-03-27 NOTE — H&P (Signed)
The patient is a 39 year old female who presents with anal pain. 39 year old female who presents to the office with complaints of hemorrhoids disease. Patient states she has had trouble with constipation and hemorrhoids since she was a child. She has had prolapsing hemorrhoids for many years. She manually reduces these. She has changed her bowel habits and eating habits and now has no further straining. She occasionally has rectal bleeding. This is usually bright red on the toilet paper. She has never had a colonoscopy. 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 ZyrTEC Allergy (10MG  Capsule, Oral) Active. Tylenol (500MG  Capsule, Oral) Active. Medications Reconciled No Current Medications  Social History , MD; 02/07/2020 4:17 PM) Alcohol use Occasional alcohol use. Caffeine use Carbonated beverages, Coffee. No drug use Tobacco use Former smoker.  Family History Romie Levee, MD; 02/07/2020 4:17 PM) Thyroid problems Mother.  Pregnancy / Birth History Romie Levee, MD; 02/07/2020 4:17 PM) Age at menarche 15 years. Contraceptive History Contraceptive implant. Gravida 1 Maternal age 38-30 Para 1 Regular periods  Other Problems 02/09/2020, MD; 02/07/2020 4:17 PM) Asthma Hemorrhoids     Review of Systems   General Present- Night Sweats. Not Present- Appetite Loss, Chills, Fatigue, Fever, Weight Gain and Weight Loss. Skin Present- Hives. Not Present- Change in Wart/Mole, Dryness, Jaundice, New Lesions, Non-Healing Wounds, Rash and Ulcer. HEENT Present- Hoarseness and Wears glasses/contact lenses. Not Present- Earache, Hearing Loss, Nose Bleed, Oral Ulcers, Ringing in the Ears, Seasonal Allergies, Sinus Pain, Sore Throat, Visual Disturbances and Yellow Eyes. Respiratory Not Present- Bloody sputum, Chronic Cough, Difficulty Breathing, Snoring and Wheezing. Breast Not Present- Breast Mass, Breast Pain, Nipple Discharge and Skin Changes. Cardiovascular Not Present- Chest Pain, Difficulty Breathing Lying Down, Leg Cramps, Palpitations, Rapid Heart Rate, Shortness of Breath and Swelling of Extremities. Gastrointestinal Present- Hemorrhoids. Not Present- Abdominal Pain, Bloating, Bloody Stool, Change in Bowel Habits, Chronic diarrhea, Constipation, Difficulty Swallowing, Excessive gas, Gets full quickly at meals, Indigestion, Nausea, Rectal Pain and Vomiting. Female Genitourinary Not Present- Frequency, Nocturia, Painful Urination, Pelvic Pain and Urgency. Musculoskeletal Not Present- Back Pain, Joint Pain, Joint Stiffness, Muscle Pain, Muscle Weakness and Swelling of Extremities. Neurological Not Present- Decreased Memory, Fainting, Headaches, Numbness, Seizures, Tingling, Tremor, Trouble walking and Weakness. Psychiatric Not Present- Anxiety, Bipolar, Change in Sleep Pattern, Depression, Fearful and Frequent crying. Endocrine Not Present- Cold Intolerance, Excessive Hunger, Hair Changes, Heat Intolerance, Hot flashes and New Diabetes. Hematology Not Present- Blood Thinners, Easy Bruising, Excessive bleeding, Gland problems, HIV and Persistent Infections.  BP 126/84   Pulse 91   Temp 98.1 F (36.7 C) (Oral)   Resp 17   Ht 5\' 7"  (1.702 m)   Wt 78.2 kg   LMP 01/25/2020   SpO2 100%   BMI 26.99 kg/m     Physical Exam   General Mental Status-Alert. General Appearance-Cooperative. CV: RRR Lungs: CTA Abd: soft Rectal Anorectal Exam External - skin tag and ulcerated. Internal - normal sphincter tone.    Assessment & Plan 02/09/2020 MD;  02/07/2020 4:15 PM)  PROLAPSED INTERNAL HEMORRHOIDS, GRADE 3 (K64.2) Impression: 39 year old female who presents to the office for evaluation of hemorrhoid disease. She states that this is been present since childhood. On exam today, she has to grade 3 hemorrhoids in the right posterior and anterior positions. There is associated edematous external skin tags. I do not think she would be a good rubber band ligation candidate due to the significant external disease. She has tried dietary modifications changing her bowel habits. She denies any constipation or straining. She continues to have symptoms on at least a weekly basis. We discussed trans-hemorrhoidal dearterialization versus standard hemorrhoidectomy. She is elected to proceed with standard hemorrhoidectomy. She understands that she will have quite a bit of postoperative pain and this could continue for several months after surgery. I have recommended that she stay out of work for at least 1 month.  Risks include bleeding, recurrence and anal stenosis.

## 2020-03-27 NOTE — Op Note (Signed)
03/27/2020  2:25 PM  PATIENT:  Marilyn Luna  39 y.o. female  Patient Care Team: Rise Patience, DO as PCP - General (Family Medicine)  PRE-OPERATIVE DIAGNOSIS:  grade 3 internal and external hemorrhoids  POST-OPERATIVE DIAGNOSIS:  grade 3 internal and external hemorrhoids  PROCEDURE:  HEMORRHOIDECTOMY - 2 COLUMN   Surgeon(s): Leighton Ruff, MD  ASSISTANT: none   ANESTHESIA:   local and MAC  SPECIMEN:  Source of Specimen:  L lateral and R posterior hemorrhoids  DISPOSITION OF SPECIMEN:  PATHOLOGY  COUNTS:  YES  PLAN OF CARE: Discharge to home after PACU  PATIENT DISPOSITION:  PACU - hemodynamically stable.  INDICATION: 39 y.o. F with grade 3 hemorrhoids and significant external hemorrhoid symptoms as well   OR FINDINGS: Grade 3 LL and RP internal hemorrhoids  DESCRIPTION: the patient was identified in the preoperative holding area and taken to the OR where they were laid on the operating room table.  MAC anesthesia was induced without difficulty. The patient was then positioned in prone jackknife position with buttocks gently taped apart.  The patient was then prepped and draped in usual sterile fashion.  SCDs were noted to be in place prior to the initiation of anesthesia. A surgical timeout was performed indicating the correct patient, procedure, positioning and need for preoperative antibiotics.  A rectal block was performed using Marcaine with epinephrine mixed with Exparel.    I began with a digital rectal exam.  There were no masses noted.  I then placed a Hill-Ferguson anoscope into the anal canal and evaluated this completely.  The patient had a large grade 3 left lateral internal hemorrhoid and a grade 3 right posterior internal hemorrhoid.  There was minimal hemorrhoid disease of the right anterior space.  I began with a left lateral hemorrhoid.  The external tissue was elevated and a 10 blade scalpel was used to transect the skin.  I then dissected down to the level  of the sphincter complex using blunt dissection and Metzenbaum scissors.  I freed the sphincter complex from the hemorrhoidal tissue sharply down past the level of the internal hemorrhoid proximally.  Once this was completed, the hemorrhoid was removed.  A 2-0 chromic suture was used to close the anoderm to the level of the dentate line.  Hemostasis was good.  The external tissue was reapproximated using a running 3-0 chromic suture.  I then turned my attention to the right posterior hemorrhoid and this was excised in similar fashion.  Metzenbaum scissors were used to transect the entire hemorrhoid and this was sent to pathology for further examination.  All hemorrhoidal tissue in the area was removed.  Sphincter complex remained intact.  Hemostasis was good.  This wound was also closed with a 2-0 chromic suture internally and a 3-0 chromic suture externally.  Additional Marcaine with Exparel was placed around the incision sites.  Lidocaine ointment and a sterile dressing was applied.  Patient was then awakened from anesthesia and sent to the postanesthesia care unit in stable condition.  All counts were correct per operating room staff.

## 2020-03-27 NOTE — Anesthesia Postprocedure Evaluation (Signed)
Anesthesia Post Note  Patient: Marilyn Luna  Procedure(s) Performed: HEMORRHOIDECTOMY (N/A Anus)     Patient location during evaluation: PACU Anesthesia Type: MAC Level of consciousness: awake and alert Pain management: pain level controlled Vital Signs Assessment: post-procedure vital signs reviewed and stable Respiratory status: spontaneous breathing, nonlabored ventilation and respiratory function stable Cardiovascular status: blood pressure returned to baseline and stable Postop Assessment: no apparent nausea or vomiting Anesthetic complications: no   No complications documented.  Last Vitals:  Vitals:   03/27/20 1515 03/27/20 1545  BP: 121/76 (!) 155/88  Pulse: 88 86  Resp: 15 16  Temp: 36.4 C 36.4 C  SpO2: 99% 98%    Last Pain:  Vitals:   03/27/20 1545  TempSrc:   PainSc: 0-No pain                 Lidia Collum

## 2020-03-27 NOTE — Transfer of Care (Signed)
Immediate Anesthesia Transfer of Care Note  Patient: Marilyn Luna  Procedure(s) Performed: Procedure(s) (LRB): HEMORRHOIDECTOMY (N/A)  Patient Location: PACU  Anesthesia Type: General  Level of Consciousness: awake, sedated, patient cooperative and responds to stimulation  Airway & Oxygen Therapy: Patient Spontanous Breathing and Patient connected to [place] 02 and soft FM   Post-op Assessment: Report given to PACU RN, Post -op Vital signs reviewed and stable and Patient moving all extremities  Post vital signs: Reviewed and stable  Complications: No apparent anesthesia complications

## 2020-03-27 NOTE — Anesthesia Procedure Notes (Signed)
Procedure Name: MAC Date/Time: 03/27/2020 1:50 PM Performed by: Justice Rocher, CRNA Pre-anesthesia Checklist: Patient identified, Emergency Drugs available, Suction available, Patient being monitored and Timeout performed Patient Re-evaluated:Patient Re-evaluated prior to induction Oxygen Delivery Method: Simple face mask Preoxygenation: Pre-oxygenation with 100% oxygen Induction Type: IV induction Placement Confirmation: positive ETCO2,  breath sounds checked- equal and bilateral and CO2 detector

## 2020-03-27 NOTE — Anesthesia Preprocedure Evaluation (Signed)
Anesthesia Evaluation  Patient identified by MRN, date of birth, ID band Patient awake    Reviewed: Allergy & Precautions, NPO status , Patient's Chart, lab work & pertinent test results  History of Anesthesia Complications Negative for: history of anesthetic complications  Airway Mallampati: II  TM Distance: >3 FB Neck ROM: Full    Dental   Pulmonary neg pulmonary ROS, former smoker,    Pulmonary exam normal        Cardiovascular negative cardio ROS Normal cardiovascular exam     Neuro/Psych negative neurological ROS  negative psych ROS   GI/Hepatic negative GI ROS, Neg liver ROS,   Endo/Other  negative endocrine ROS  Renal/GU negative Renal ROS  negative genitourinary   Musculoskeletal negative musculoskeletal ROS (+)   Abdominal   Peds  Hematology negative hematology ROS (+)   Anesthesia Other Findings   Reproductive/Obstetrics                             Anesthesia Physical Anesthesia Plan  ASA: I  Anesthesia Plan: MAC   Post-op Pain Management:    Induction: Intravenous  PONV Risk Score and Plan: Propofol infusion, TIVA and Treatment may vary due to age or medical condition  Airway Management Planned: Natural Airway, Nasal Cannula and Simple Face Mask  Additional Equipment: None  Intra-op Plan:   Post-operative Plan:   Informed Consent: I have reviewed the patients History and Physical, chart, labs and discussed the procedure including the risks, benefits and alternatives for the proposed anesthesia with the patient or authorized representative who has indicated his/her understanding and acceptance.       Plan Discussed with:   Anesthesia Plan Comments:         Anesthesia Quick Evaluation

## 2020-03-28 ENCOUNTER — Encounter (HOSPITAL_BASED_OUTPATIENT_CLINIC_OR_DEPARTMENT_OTHER): Payer: Self-pay | Admitting: General Surgery

## 2020-03-28 LAB — SURGICAL PATHOLOGY

## 2020-03-28 NOTE — Addendum Note (Signed)
Addendum  created 03/28/20 1459 by Jessica Priest, CRNA   Charge Capture section accepted

## 2020-06-13 DIAGNOSIS — Z20822 Contact with and (suspected) exposure to covid-19: Secondary | ICD-10-CM | POA: Diagnosis not present

## 2020-08-15 ENCOUNTER — Telehealth: Payer: Self-pay

## 2020-08-15 ENCOUNTER — Emergency Department (HOSPITAL_COMMUNITY)
Admission: EM | Admit: 2020-08-15 | Discharge: 2020-08-15 | Disposition: A | Discharge status: Home / Self Care | Payer: BC Managed Care – PPO | Attending: Emergency Medicine | Admitting: Emergency Medicine

## 2020-08-15 ENCOUNTER — Other Ambulatory Visit: Payer: Self-pay

## 2020-08-15 ENCOUNTER — Encounter (HOSPITAL_COMMUNITY): Payer: Self-pay

## 2020-08-15 DIAGNOSIS — Z79899 Other long term (current) drug therapy: Secondary | ICD-10-CM | POA: Diagnosis not present

## 2020-08-15 DIAGNOSIS — Z87891 Personal history of nicotine dependence: Secondary | ICD-10-CM | POA: Diagnosis not present

## 2020-08-15 DIAGNOSIS — R202 Paresthesia of skin: Secondary | ICD-10-CM | POA: Insufficient documentation

## 2020-08-15 LAB — CBC WITH DIFFERENTIAL/PLATELET
Abs Immature Granulocytes: 0.02 10*3/uL (ref 0.00–0.07)
Basophils Absolute: 0 10*3/uL (ref 0.0–0.1)
Basophils Relative: 0 %
Eosinophils Absolute: 0 10*3/uL (ref 0.0–0.5)
Eosinophils Relative: 0 %
HCT: 35.5 % — ABNORMAL LOW (ref 36.0–46.0)
Hemoglobin: 11.7 g/dL — ABNORMAL LOW (ref 12.0–15.0)
Immature Granulocytes: 0 %
Lymphocytes Relative: 12 %
Lymphs Abs: 0.9 10*3/uL (ref 0.7–4.0)
MCH: 31.2 pg (ref 26.0–34.0)
MCHC: 33 g/dL (ref 30.0–36.0)
MCV: 94.7 fL (ref 80.0–100.0)
Monocytes Absolute: 0.4 10*3/uL (ref 0.1–1.0)
Monocytes Relative: 6 %
Neutro Abs: 6.3 10*3/uL (ref 1.7–7.7)
Neutrophils Relative %: 82 %
Platelets: 251 10*3/uL (ref 150–400)
RBC: 3.75 MIL/uL — ABNORMAL LOW (ref 3.87–5.11)
RDW: 12.5 % (ref 11.5–15.5)
WBC: 7.7 10*3/uL (ref 4.0–10.5)
nRBC: 0 % (ref 0.0–0.2)

## 2020-08-15 LAB — BASIC METABOLIC PANEL
Anion gap: 6 (ref 5–15)
BUN: 18 mg/dL (ref 6–20)
CO2: 27 mmol/L (ref 22–32)
Calcium: 8.8 mg/dL — ABNORMAL LOW (ref 8.9–10.3)
Chloride: 102 mmol/L (ref 98–111)
Creatinine, Ser: 0.81 mg/dL (ref 0.44–1.00)
GFR, Estimated: >60 mL/min (ref >60)
Glucose, Bld: 107 mg/dL — ABNORMAL HIGH (ref 70–99)
Potassium: 3.4 mmol/L — ABNORMAL LOW (ref 3.5–5.1)
Sodium: 135 mmol/L (ref 135–145)

## 2020-08-15 LAB — I-STAT BETA HCG BLOOD, ED (MC, WL, AP ONLY): I-stat hCG, quantitative: <5 m[IU]/mL (ref <5)

## 2020-08-15 NOTE — Discharge Instructions (Addendum)
You have been seen and discharged from the emergency department.  Your blood work was normal.  Follow-up with your primary provider for further evaluation to rule out further causes including autoimmune/hereditary if this does not resolve. Take home medications as prescribed. If you have any worsening symptoms, weakness, fevers or further concerns for health please return to an emergency department for further evaluation.

## 2020-08-15 NOTE — Progress Notes (Signed)
SUBJECTIVE:   CHIEF COMPLAINT / HPI:   "Tingling in hands, lips and feet": Patient is a 40 year old female that presents today to discuss the above.  She states she sneezed badly on 08/12/20 and got a pain under her arms and some tingling in her hands after that. Yesterday started noticing tingling in her feet.  Patient states that she also noted some  tingling in her lips and tongue which started yesterday.  She did go to the urgent care yesterday and had some blood work drawn which did show a slightly low calcium level of 8.8, however that did not check an albumin level at the time.  PERTINENT  PMH / PSH: None relevant  OBJECTIVE:   BP 128/81   Pulse 95   Ht 5\' 8"  (1.727 m)   Wt 178 lb 3.2 oz (80.8 kg)   LMP 08/15/2020   SpO2 98%   BMI 27.10 kg/m    General: NAD, pleasant, able to participate in exam Cardiac: RRR, no murmurs. Respiratory: CTAB, normal effort MSK: Strength 5/5 in upper and lower extremities bilaterally, no obvious bony defects noted in upper or lower extremities.  Negative Phalen's, negative Tinel's, fine touch sensation intact in upper and lower extremities bilaterally.  No obvious pain to palpation of her hands or feet.  Hands and feet appear well perfused with good peripheral pulses. Neuro: alert, no obvious focal deficits Psych: Normal affect and mood  ASSESSMENT/PLAN:   Hypocalcemia Assessment: 40 year old female the presents today with 1-2 days of tingling of her tongue, lips, hands, feet in the setting of a slightly decreased calcium level of 8.8 which was collected already urgent care yesterday.  Unfortunately we do not have an albumin level collected to determine corrected calcium level. Plan: -We will check a CMP today to recheck her calcium level as well as an albumin level to calculate corrected calcium -If calcium level is still low we will perform some form additional testing including PTH and will recommend calcium supplementation   Tingling  in hands and feet: Assessment: 40 year old female presenting with 5 days of tingling in her hands which started after a hard sneeze as well as tingling in her lips, tongue, feet which started about 1 to 2 days ago.  She has gone to the urgent care to be evaluated and had a calcium level which was slightly low at 8.8.  Overall differential for the tingling in the hands can include carpal tunnel though she has negative Tinel's, negative Phalen's today.  Strength is 5/5 in upper extremities and lower extremities bilaterally.  Hypocalcemia could explain the tingling of the lips, tongue, hands, feet though her level was only slightly low at 8.8.  Other cause of the tingling in her hands and feet may be due to her sneezing which case I would expect it to resolve in the next week or so. Plan: -We will check a CMP to evaluate calcium and albumin level as above -Recommended ibuprofen to try for the next 1 week to see if that improves the tingling in her hands as this can be from nerve irritation from her sneeze and if this is the case that should improve by that time. -Patient plans to follow-up in the next 1 to 2 weeks if her symptoms do not improve regardless of what her calcium level shows.  30, DO [facility]    This note was prepared using Dragon voice recognition software and may include unintentional dictation errors due to  the inherent limitations of voice recognition software.

## 2020-08-15 NOTE — ED Provider Notes (Addendum)
[facility]-EMERGENCY DEPT Provider Note   CSN: [id number] Arrival date & time: 08/15/20  1956     History No chief complaint on file.   Marilyn Luna is a 40 y.o. female.  HPI 40 year old female presents the emergency department with concern for pins-and-needles sensation in her bilateral hands, bilateral feet and around her lips.  Patient states a couple days ago she had a "large sneeze" and following that she started feeling pins and needle sensation in the palms of her bilateral hands.  Since this started this has been constant, starting yesterday she started having pins-and-needles sensation in the soles of her bilateral feet and today she developed perioral pins-and-needles sensation.  She never had these symptoms before.  She denies any headache, chest pain.  She has had no other neuro symptoms including vision change, facial droop, speech changes.  She has no headache or neck pain.  She has had a mild decrease in p.o. intake but no other acute illness.  She is currently being treated for a conjunctivitis but no other new medications.  Past Medical History:  Diagnosis Date  . Abnormal Pap smear   . Chest pain    last week did not go to dr while sleeping x 1 day and once week before that not sure how long lasted  . H/O varicella   . Hemorrhoids   . Hx of chlamydia infection     Patient Active Problem List   Diagnosis Date Noted  . Hemorrhoids 12/28/2019  . Left paraspinal back pain 05/18/2019  . Rash and nonspecific skin eruption 01/05/2018  . Contraceptive management 08/06/2017    Past Surgical History:  Procedure Laterality Date  . HEMORRHOID SURGERY N/A 03/27/2020   Procedure: HEMORRHOIDECTOMY;  Surgeon: Romie Levee, MD;  Location: [facility];  Service: General;  Laterality: N/A;  . NO PAST SURGERIES    . teeth extraction wisdom  yrs ago     OB History    Gravida  1   Para  1   Term  1   Preterm  0   AB  0   Living  1      SAB  0   IAB  0   Ectopic  0   Multiple  0   Live Births  1           Family History  Problem Relation Age of Onset  . Hypertension Maternal Aunt   . Cancer Paternal Uncle        lung  . Diabetes Maternal Grandmother   . Asthma Brother     Social History   Tobacco Use  . Smoking status: Former Smoker    Packs/day: 0.50    Years: 10.00    Pack years: 5.00    Types: Cigarettes    Quit date: 07/04/2017    Years since quitting: 3.1  . Smokeless tobacco: Never Used  Vaping Use  . Vaping Use: Never used  Substance Use Topics  . Alcohol use: Not Currently    Alcohol/week: 2.0 standard drinks    Types: 2 Glasses of wine per week  . Drug use: No    Home Medications Prior to Admission medications   Medication Sig Start Date End Date Taking? Authorizing Provider  acyclovir (ZOVIRAX) 800 MG tablet Take 4,000 mg by mouth 5 (five) times daily. 08/12/20  Yes [provider]  Biotin w/ Vitamins C & E (HAIR/SKIN/NAILS) 1250-7.5-7.5 MCG-MG-UNT CHEW Chew 1 tablet by mouth daily.  Yes [provider]  doxycycline (VIBRA-TABS) 100 MG tablet Take 100 mg by mouth 2 (two) times daily. 08/12/20  Yes [provider]  methylPREDNISolone (MEDROL DOSEPAK) 4 MG TBPK tablet Take 4-24 mg by mouth as directed. Take 6 tablets on Day 1 Take 5 tablets on Day 2 Take 4 tablets on Day 3 Take 3 tablets on Day 4 Take 2 tablets on Day 5 Take 1 tablet on Day 6 08/12/20  Yes [provider]  Multiple Vitamins-Minerals (MULTIVITAMIN ADULTS PO) Take 2 tablets by mouth daily.   Yes [provider]  aspirin-sod bicarb-citric acid (ALKA-SELTZER) 325 MG TBEF tablet Take 325 mg by mouth every 6 (six) hours as needed. Allergy and sinus    [provider]  diazepam (VALIUM) 5 MG tablet Take 1 tablet (5 mg total) by mouth every 8 (eight) hours as needed (pelvic floor spasms, inability to urinate). Patient not taking: No sig reported 03/27/20 03/27/21   Romie Levee, MD  oxyCODONE (OXY IR/ROXICODONE) 5 MG immediate release tablet Take 1-2 tablets (5-10 mg total) by mouth every 6 (six) hours as needed. Patient not taking: Reported on 08/15/2020 03/27/20   Romie Levee, MD    Allergies    Patient has no known allergies.  Review of Systems   Review of Systems  Constitutional: Negative for chills and fever.  HENT: Negative for congestion.   Eyes: Negative for visual disturbance.  Respiratory: Negative for shortness of breath.   Cardiovascular: Negative for chest pain.  Gastrointestinal: Negative for abdominal pain, diarrhea and vomiting.  Genitourinary: Negative for dysuria.  Skin: Negative for rash.  Neurological: Negative for headaches.       + pins and needles sensation in hands, feet and lips    Physical Exam Updated Vital Signs BP (!) 149/87   Pulse 84   Temp 98.1 F (36.7 C) (Oral)   Resp 15   Ht 5\' 7"  (1.702 m)   Wt 78 kg   LMP 08/15/2020   SpO2 97%   BMI 26.94 kg/m   Physical Exam Vitals and nursing note reviewed.  Constitutional:      Appearance: Normal appearance.  HENT:     Head: Normocephalic.     Mouth/Throat:     Mouth: Mucous membranes are moist.  Cardiovascular:     Rate and Rhythm: Normal rate.  Pulmonary:     Effort: Pulmonary effort is normal. No respiratory distress.  Abdominal:     Palpations: Abdomen is soft.     Tenderness: There is no abdominal tenderness.  Skin:    General: Skin is warm.  Neurological:     Mental Status: She is alert and oriented to person, place, and time. Mental status is at baseline.     Comments: Sensory is in tact, she has no noted neuro deficits on exam  Psychiatric:        Mood and Affect: Mood normal.     ED Results / Procedures / Treatments   Labs (all labs ordered are listed, but only abnormal results are displayed) Labs Reviewed  CBC WITH DIFFERENTIAL/PLATELET  BASIC METABOLIC PANEL  I-STAT BETA HCG BLOOD, ED ([facility], [facility], [facility] ONLY)     EKG None  Radiology No results found.  Procedures Procedures   Medications Ordered in ED Medications - No data to display  ED Course  I have reviewed the triage vital signs and the nursing notes.  Pertinent labs & imaging results that were available during my care of the patient were reviewed  by me and considered in my medical decision making (see chart for details).    MDM Rules/Calculators/A&P                          39 year old female presents the emergency department with pins-and-needles sensation of her bilateral hands, bilateral feet and lips.  No other associated neurologic complaints.  No headache, neck pain or recent injury.  Vitals are normal.  She is neurologically intact, no actual loss of sensation.  Blood work is reassuring with no acute findings.  Low suspicion for neurologic cause/CVA given diffuse nature.  Electrolytes are normal.  Advised patient to follow up with primary doctor for further evaluation and blood work. Patient will be discharged and treated as an outpatient.  Discharge plan and strict return to ED precautions discussed, patient verbalizes understanding and agreement.  Final Clinical Impression(s) / ED Diagnoses Final diagnoses:  None    Rx / DC Orders ED Discharge Orders    None       Rozelle Logan, DO 08/15/20 2211    Rozelle Logan, DO 08/15/20 2239

## 2020-08-15 NOTE — Telephone Encounter (Signed)
Patient calls nurse line requesting to schedule appointment for tingling in hands and feet. Patient reports this started last week. States that she had a "hard sneeze" on Sunday evening in which she noticed neck and upper back pain. Reports that a few days later she began noticing tingling sensation in hands and feet.   Scheduled patient for OV tomorrow afternoon with Dr. Atha Starks.   Strict ED precautions given.   Veronda Prude, RN

## 2020-08-15 NOTE — ED Triage Notes (Signed)
Pt states she was vomiting last week and then realized her eyes were red, she went to the eye doctor and had an eye infection Pt then states that she had a hard sneeze and since then her hands and feet have been tingling and now her lips are numb

## 2020-08-15 NOTE — Patient Instructions (Signed)
It was great to see you! Thank you for allowing me to participate in your care!  Our plans for today:  -For the tingling in your hands, feet, and lips there is a possibility of a low calcium level can cause this.  We are going to check some further labs today to evaluate for this. -In the meantime you can use some ibuprofen for the tingling in your hands and see if that improves it.  It is possible that it is from the sneeze and if this is the case it should get better in the next 1 week. -I will let you know the results of your labs when we check for your calcium level and if we need to start a calcium supplement I will let you know that.  I would like for you make a follow-up appointment in 1 month with myself or your PCP to ensure that you have gotten back to normal.   We are checking some labs today, I will call you if they are abnormal will send you a MyChart message or a letter if they are normal.  If you do not hear about your labs in the next 2 weeks please let us know.  Take care and seek immediate care sooner if you develop any concerns.   Dr. Jackelyn Poling, DO [facility]

## 2020-08-16 ENCOUNTER — Encounter: Payer: Self-pay | Admitting: Family Medicine

## 2020-08-16 ENCOUNTER — Ambulatory Visit (INDEPENDENT_AMBULATORY_CARE_PROVIDER_SITE_OTHER): Payer: BC Managed Care – PPO | Admitting: Family Medicine

## 2020-08-16 ENCOUNTER — Other Ambulatory Visit: Payer: Self-pay

## 2020-08-16 NOTE — Assessment & Plan Note (Signed)
Assessment: 40 year old female the presents today with 1-2 days of tingling of her tongue, lips, hands, feet in the setting of a slightly decreased calcium level of 8.8 which was collected already urgent care yesterday.  Unfortunately we do not have an albumin level collected to determine corrected calcium level. Plan: -We will check a CMP today to recheck her calcium level as well as an albumin level to calculate corrected calcium -If calcium level is still low we will perform some form additional testing including PTH and will recommend calcium supplementation

## 2020-08-17 LAB — COMPREHENSIVE METABOLIC PANEL
ALT: 9 IU/L (ref 0–32)
AST: 12 IU/L (ref 0–40)
Albumin/Globulin Ratio: 1.5 (ref 1.2–2.2)
Albumin: 4.3 g/dL (ref 3.8–4.8)
Alkaline Phosphatase: 59 IU/L (ref 44–121)
BUN/Creatinine Ratio: 12 (ref 9–23)
BUN: 11 mg/dL (ref 6–20)
Bilirubin Total: 0.7 mg/dL (ref 0.0–1.2)
CO2: 25 mmol/L (ref 20–29)
Calcium: 9.5 mg/dL (ref 8.7–10.2)
Chloride: 100 mmol/L (ref 96–106)
Creatinine, Ser: 0.91 mg/dL (ref 0.57–1.00)
Globulin, Total: 2.9 g/dL (ref 1.5–4.5)
Glucose: 114 mg/dL — ABNORMAL HIGH (ref 65–99)
Potassium: 3.5 mmol/L (ref 3.5–5.2)
Sodium: 139 mmol/L (ref 134–144)
Total Protein: 7.2 g/dL (ref 6.0–8.5)
eGFR: 83 mL/min/{1.73_m2} (ref >59)

## 2021-03-07 DIAGNOSIS — H10011 Acute follicular conjunctivitis, right eye: Secondary | ICD-10-CM | POA: Diagnosis not present

## 2021-04-24 DIAGNOSIS — R059 Cough, unspecified: Secondary | ICD-10-CM | POA: Diagnosis not present

## 2021-04-24 DIAGNOSIS — R509 Fever, unspecified: Secondary | ICD-10-CM | POA: Diagnosis not present

## 2021-04-29 ENCOUNTER — Other Ambulatory Visit: Payer: Self-pay

## 2021-04-29 ENCOUNTER — Ambulatory Visit (INDEPENDENT_AMBULATORY_CARE_PROVIDER_SITE_OTHER): Payer: BC Managed Care – PPO | Admitting: Family Medicine

## 2021-04-29 VITALS — BP 108/64 | HR 81

## 2021-04-29 DIAGNOSIS — J069 Acute upper respiratory infection, unspecified: Secondary | ICD-10-CM

## 2021-04-29 NOTE — Patient Instructions (Signed)
It was great to meet you!  You likely had a virus that caused all your symptoms. Whether this was flu, RSV, or some other virus we don't know but the treatment is the same regardless. Your fatigue and cough may persist for the next week or even two weeks.  You can continue Delsym as needed. Flonase may also be helpful as your nose looks slightly inflamed on exam today. Other ideas include humidifier/steam showers and cough drops/lozenges as needed.  Let us know if you develop new fever, difficulty breathing, or other concerns.  Take care,  Dr. [facility]

## 2021-04-29 NOTE — Progress Notes (Signed)
    SUBJECTIVE:   CHIEF COMPLAINT / HPI:   Cough Symptoms started almost 2 weeks ago. On November 2nd-3rd patient developed sore throat. On Nov 4th reports she started feeling terrible-- had significant cough and mucus as well as body aches. Cough has been fairly persistent since then. Delsym is helpful.  Symptoms finally seem to be improving today. Last night was the first time she slept without taking medication. She denies any fever. No chest pain or SOB. No GI symptoms. Of note, she was seen at Urgent Care on 04/24/21 and was flu negative. Home COVID test was negative as well.  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  PERTINENT  PMH / PSH: none  OBJECTIVE:   BP 108/64   Pulse 81   LMP 03/31/2021   SpO2 99%   General: NAD, pleasant, able to participate in exam HEENT: PERRL, normal sclera and conjunctiva, TM normal bilaterally, oropharynx clear without erythema, edema or exudate, nares patent, mildly edematous nasal turbinates Neck: no cervical lymphadenopathy Cardiac: RRR, S1 S2 present. normal heart sounds, no murmurs. Respiratory: CTAB, normal effort, No wheezes, rales or rhonchi Extremities: no edema or cyanosis. Skin: warm and dry, no rashes noted Neuro: alert, no obvious focal deficits Psych: Normal affect and mood   ASSESSMENT/PLAN:   Viral URI Patient presentation consistent with viral URI. Fortunately, her symptoms are now improving. Previously tested for COVID and flu and was negative. No fever, dyspnea, or other concerning factors. No suspicion for pneumonia, bacterial sinusitis, or underlying lung disease at this time. -Reassurance provided -Continue supportive care as needed -Return precautions discussed   Maury Dus, MD [facility]

## 2021-05-27 DIAGNOSIS — Z20822 Contact with and (suspected) exposure to covid-19: Secondary | ICD-10-CM | POA: Diagnosis not present

## 2021-06-27 ENCOUNTER — Ambulatory Visit (INDEPENDENT_AMBULATORY_CARE_PROVIDER_SITE_OTHER): Payer: BC Managed Care – PPO | Admitting: Family Medicine

## 2021-06-27 ENCOUNTER — Encounter: Payer: Self-pay | Admitting: Family Medicine

## 2021-06-27 ENCOUNTER — Other Ambulatory Visit: Payer: Self-pay

## 2021-06-27 ENCOUNTER — Encounter: Payer: BC Managed Care – PPO | Admitting: Family Medicine

## 2021-06-27 VITALS — BP 117/73 | HR 72 | Ht 68.0 in | Wt 181.2 lb

## 2021-06-27 DIAGNOSIS — Z1159 Encounter for screening for other viral diseases: Secondary | ICD-10-CM

## 2021-06-27 DIAGNOSIS — R21 Rash and other nonspecific skin eruption: Secondary | ICD-10-CM | POA: Diagnosis not present

## 2021-06-27 NOTE — Progress Notes (Signed)
° ° °  SUBJECTIVE:   CHIEF COMPLAINT / HPI:   Patient declines pap smear today.  Patient presents today for a rash on her bilateral hands that occurred last week.  She notes that she had a sudden appearance of several spots of dark orange-colored lesions on her hands, there were no associated symptoms such as pruritus, irritation, fevers, pain.  The rash gradually went away over the next few days without recurrence since.  PERTINENT  PMH / PSH: Reviewed  OBJECTIVE:   BP 117/73    Pulse 72    Ht 5\' 8"  (1.727 m)    Wt 181 lb 3.2 oz (82.2 kg)    LMP 06/05/2021    SpO2 100%    BMI 27.55 kg/m   General: NAD, well-appearing, well-nourished Respiratory: No respiratory distress, breathing comfortably, able to speak in full sentences Skin: warm and dry, no rashes noted on exposed skin Psych: Appropriate affect and mood  Patient did show photos of her hands from 1/6 and was noted to have several patches of orange pigmented areas.  ASSESSMENT/PLAN:   Bilateral hand rash Has already resolved, does not appear consistent with any allergic dermatitis.  Patient does not consume food items with significant carotene that would account for orange discoloration. Will test for RPR and continue to monitor for recurrence.   Healthcare maintenance - Hep C screening today - Decline pap smear, completes them with her gynecologist.    06/07/2021, DO [facility]

## 2021-06-27 NOTE — Patient Instructions (Signed)
I do not know exactly what the rash that was on your hands is, the only thing I think would be pertinent to test at this time is for syphilis. The results will take a few days to come back and I will let  you know via MyChart. If they are negative, I think we will be fine to just watch and see if if happens again.

## 2021-06-28 ENCOUNTER — Encounter: Payer: Self-pay | Admitting: Family Medicine

## 2021-06-28 LAB — HCV INTERPRETATION

## 2021-06-28 LAB — HCV AB W REFLEX TO QUANT PCR: HCV Ab: <0.1 s/co ratio (ref 0.0–0.9)

## 2021-06-30 LAB — T PALLIDUM ANTIBODY, EIA: T pallidum Antibody, EIA: NEGATIVE

## 2021-06-30 LAB — RPR W/REFLEX TO TREPSURE: RPR: NONREACTIVE

## 2021-07-02 ENCOUNTER — Other Ambulatory Visit: Payer: Self-pay | Admitting: Family Medicine

## 2021-08-04 ENCOUNTER — Encounter: Payer: Self-pay | Admitting: Family Medicine

## 2021-08-05 NOTE — Telephone Encounter (Signed)
Spoke with patient and let her know that we can schedule the lab appt when she is feeling better. Patient voiced understanding and will let us know when she is better and we can put her on the lab schedule.  Marilyn Luna,CMA

## 2021-08-06 ENCOUNTER — Other Ambulatory Visit: Payer: BC Managed Care – PPO

## 2021-08-06 ENCOUNTER — Encounter: Payer: Self-pay | Admitting: Family Medicine

## 2021-08-06 ENCOUNTER — Other Ambulatory Visit: Payer: Self-pay

## 2021-08-07 LAB — COMPREHENSIVE METABOLIC PANEL
ALT: 20 IU/L (ref 0–32)
AST: 19 IU/L (ref 0–40)
Albumin/Globulin Ratio: 1.7 (ref 1.2–2.2)
Albumin: 4.2 g/dL (ref 3.8–4.8)
Alkaline Phosphatase: 58 IU/L (ref 44–121)
BUN/Creatinine Ratio: 16 (ref 9–23)
BUN: 13 mg/dL (ref 6–20)
Bilirubin Total: 0.5 mg/dL (ref 0.0–1.2)
CO2: 24 mmol/L (ref 20–29)
Calcium: 8.9 mg/dL (ref 8.7–10.2)
Chloride: 100 mmol/L (ref 96–106)
Creatinine, Ser: 0.81 mg/dL (ref 0.57–1.00)
Globulin, Total: 2.5 g/dL (ref 1.5–4.5)
Glucose: 84 mg/dL (ref 70–99)
Potassium: 3.8 mmol/L (ref 3.5–5.2)
Sodium: 136 mmol/L (ref 134–144)
Total Protein: 6.7 g/dL (ref 6.0–8.5)
eGFR: 95 mL/min/{1.73_m2} (ref >59)

## 2021-09-09 DIAGNOSIS — Z124 Encounter for screening for malignant neoplasm of cervix: Secondary | ICD-10-CM | POA: Diagnosis not present

## 2021-09-09 DIAGNOSIS — Z6829 Body mass index (BMI) 29.0-29.9, adult: Secondary | ICD-10-CM | POA: Diagnosis not present

## 2021-09-09 DIAGNOSIS — Z01419 Encounter for gynecological examination (general) (routine) without abnormal findings: Secondary | ICD-10-CM | POA: Diagnosis not present

## 2021-11-18 ENCOUNTER — Encounter: Payer: Self-pay | Admitting: *Deleted

## 2021-11-26 ENCOUNTER — Telehealth: Payer: Self-pay

## 2021-11-26 NOTE — Telephone Encounter (Signed)
Patient calls nurse line reporting an ongoing for cough for 1 week.   Patient denies any other symptoms. No fevers, chills, body aches, congestion, SOB or GI symptoms. Patient denies any sick contacts.   Patient has tried Delsym and Catering manager Plus with no relief.   Patient plans to self test for covid.   Patient scheduled for 6/15 for evaluation.

## 2021-11-27 ENCOUNTER — Ambulatory Visit: Payer: BC Managed Care – PPO

## 2021-11-27 NOTE — Progress Notes (Deleted)
    SUBJECTIVE:   CHIEF COMPLAINT / HPI:   Cough  X1 week denies fever, chills, body aches, congestion, shortness of breath.  Also denies any known sick contacts.  Has tried Delsym and Alka-Seltzer without improvement.  Home COVID test ***   PERTINENT  PMH / PSH: ***  OBJECTIVE:   There were no vitals taken for this visit.  ***  ASSESSMENT/PLAN:   No problem-specific Assessment & Plan notes found for this encounter.   DDX of difficult airway managment all start with A and include:  Adherence Ace inhibitors Acid reflux Active sinus disease Alpha-1 antitripsin deficiency,  Anxiety  ABPA Allergies (esp in young) Aspiration (esp in elderly) Adverse effects of DPI Active smokers  Two B's: Bronchiectasis Beta blockers  One C:  CHF   Lavonda Jumbo, DO [facility]

## 2022-02-02 ENCOUNTER — Encounter: Payer: Self-pay | Admitting: Family Medicine

## 2022-02-02 ENCOUNTER — Ambulatory Visit (INDEPENDENT_AMBULATORY_CARE_PROVIDER_SITE_OTHER): Payer: BC Managed Care – PPO | Admitting: Family Medicine

## 2022-02-02 VITALS — BP 118/80 | HR 77 | Ht 68.0 in | Wt 184.1 lb

## 2022-02-02 DIAGNOSIS — S29012A Strain of muscle and tendon of back wall of thorax, initial encounter: Secondary | ICD-10-CM

## 2022-02-02 MED ORDER — BACLOFEN 10 MG PO TABS: 10.0000 mg | ORAL_TABLET | Freq: Three times a day (TID) | ORAL | 0 refills | Status: DC

## 2022-02-02 NOTE — Patient Instructions (Addendum)
It seems like he may have strained a muscle in your upper back during your workout.  I am sending you a medication called baclofen which is a muscle relaxer.  I recommend cutting the pill in half at first and trying those doses and see if they work.  Recommend trying the first dose in the afternoon to see if it makes you sleepy or not.  If it does not, you can take it during the day up to 3 times per day as well.  You can alternate Tylenol ibuprofen as needed for the pain.  I recommend doing some gentle stretches and deep breathing exercises.  Hold off on any kind of true workouts probably for the next week or so until you start feeling little better.  If he can tolerate walking, I would recommend doing that daily.  Call back in the next week if your pain is worsening or not improving.

## 2022-02-02 NOTE — Progress Notes (Signed)
    SUBJECTIVE:   CHIEF COMPLAINT / HPI:   Back pain - Started after work-out last Wednesday - Left mid-upper back pain - Pain started day after the workout - Had been out for 2 weeks for vacation prior to this  - No red flag symptoms present - Some pain with breathing exertionally   PERTINENT  PMH / PSH: Reviewed  OBJECTIVE:   There were no vitals taken for this visit.  General: NAD, well-appearing, well-nourished Respiratory: No respiratory distress, breathing comfortably, able to speak in full sentences Skin: warm and dry, no rashes noted on exposed skin Psych: Appropriate affect and mood MSK: slightly TTP in the left lateral mid/lower thoracic region. Slightly limited truncal rotation secondary to left sided pain. Able to reach about 85% of full ROM of the back with forward bending while going slow.  ASSESSMENT/PLAN:   Muscle strain of left mid/lower thoracic region Physical exam consistent with mild strain of the mid/lower thoracic region of the left back.  No red flags on physical exam or history.  Given prolonged course of symptoms, though noting some improvement we will prescribe muscle relaxer with strict return precautions. - Baclofen 10 mg 3 times daily as needed, recommended patient's start with 5 mg at night and go from there - Stretching and breathing exercises recommended - Only do exercises as tolerated, recommend walking - Call back in 1 week if no improvement or immediately if worsening - Can also alternate Tylenol ibuprofen as needed - Heat/ice per preference  Evelena Leyden, DO [facility]

## 2022-07-11 ENCOUNTER — Encounter: Payer: Self-pay | Admitting: Physician Assistant

## 2022-07-11 ENCOUNTER — Telehealth: Payer: BC Managed Care – PPO | Admitting: Physician Assistant

## 2022-07-11 DIAGNOSIS — J069 Acute upper respiratory infection, unspecified: Secondary | ICD-10-CM | POA: Diagnosis not present

## 2022-07-11 MED ORDER — BENZONATATE 100 MG PO CAPS: ORAL_CAPSULE | ORAL | 0 refills | Status: DC

## 2022-07-11 MED ORDER — FLUTICASONE PROPIONATE 50 MCG/ACT NA SUSP: 2.0000 | Freq: Every day | NASAL | 0 refills | Status: DC

## 2022-07-11 NOTE — Progress Notes (Signed)
E-Visit for Upper Respiratory Infection   We are sorry you are not feeling well.  Here is how we plan to help!  Based on what you have shared with me, it looks like you may have a viral upper respiratory infection.  Upper respiratory infections are caused by a large number of viruses; however, rhinovirus is the most common cause.   Symptoms vary from person to person, with common symptoms including sore throat, cough, fatigue or lack of energy and feeling of general discomfort.  A low-grade fever of up to 100.4 may present, but is often uncommon.  Symptoms vary however, and are closely related to a person's age or underlying illnesses.  The most common symptoms associated with an upper respiratory infection are nasal discharge or congestion, cough, sneezing, headache and pressure in the ears and face.  These symptoms usually persist for about 3 to 10 days, but can last up to 2 weeks.  It is important to know that upper respiratory infections do not cause serious illness or complications in most cases.    Upper respiratory infections can be transmitted from person to person, with the most common method of transmission being a person's hands.  The virus is able to live on the skin and can infect other persons for up to 2 hours after direct contact.  Also, these can be transmitted when someone coughs or sneezes; thus, it is important to cover the mouth to reduce this risk.  To keep the spread of the illness at bay, good hand hygiene is very important.  This is an infection that is most likely caused by a virus. There are no specific treatments other than to help you with the symptoms until the infection runs its course.  We are sorry you are not feeling well.  Here is how we plan to help!   For nasal congestion, you may use an oral decongestants such as Mucinex D or if you have glaucoma or high blood pressure use plain Mucinex.  Saline nasal spray or nasal drops can help and can safely be used as often as  needed for congestion.  For your congestion, I have prescribed Fluticasone nasal spray one spray in each nostril twice a day  If you do not have a history of heart disease, hypertension, diabetes or thyroid disease, prostate/bladder issues or glaucoma, you may also use Sudafed to treat nasal congestion.  It is highly recommended that you consult with a pharmacist or your primary care physician to ensure this medication is safe for you to take.     If you have a cough, you may use cough suppressants such as Delsym and Robitussin.  If you have glaucoma or high blood pressure, you can also use Coricidin HBP.   For cough I have prescribed for you A prescription cough medication called Tessalon Perles 100 mg. You may take 1-2 capsules every 8 hours as needed for cough  If you have a sore or scratchy throat, use a saltwater gargle-  to  teaspoon of salt dissolved in a 4-ounce to 8-ounce glass of warm water.  Gargle the solution for approximately 15-30 seconds and then spit.  It is important not to swallow the solution.  You can also use throat lozenges/cough drops and Chloraseptic spray to help with throat pain or discomfort.  Warm or cold liquids can also be helpful in relieving throat pain.  For headache, pain or general discomfort, you can use Ibuprofen or Tylenol as directed.   Some authorities believe   that zinc sprays or the use of Echinacea may shorten the course of your symptoms.   HOME CARE Only take medications as instructed by your medical team. Be sure to drink plenty of fluids. Water is fine as well as fruit juices, sodas and electrolyte beverages. You may want to stay away from caffeine or alcohol. If you are nauseated, try taking small sips of liquids. How do you know if you are getting enough fluid? Your urine should be a pale yellow or almost colorless. Get rest. Taking a steamy shower or using a humidifier may help nasal congestion and ease sore throat pain. You can place a towel over  your head and breathe in the steam from hot water coming from a faucet. Using a saline nasal spray works much the same way. Cough drops, hard candies and sore throat lozenges may ease your cough. Avoid close contacts especially the very young and the elderly Cover your mouth if you cough or sneeze Always remember to wash your hands.   GET HELP RIGHT AWAY IF: You develop worsening fever. If your symptoms do not improve within 10 days You develop yellow or green discharge from your nose over 3 days. You have coughing fits You develop a severe head ache or visual changes. You develop shortness of breath, difficulty breathing or start having chest pain Your symptoms persist after you have completed your treatment plan  MAKE SURE YOU  Understand these instructions. Will watch your condition. Will get help right away if you are not doing well or get worse.  Thank you for choosing an e-visit.  Your e-visit answers were reviewed by a board certified advanced clinical practitioner to complete your personal care plan. Depending upon the condition, your plan could have included both over the counter or prescription medications.  Please review your pharmacy choice. Make sure the pharmacy is open so you can pick up prescription now. If there is a problem, you may contact your provider through [employer] and have the prescription routed to another pharmacy.  Your safety is important to [place]. If you have drug allergies check your prescription carefully.   For the next 24 hours you can use MyChart to ask questions about today's visit, request a non-urgent call back, or ask for a work or school excuse. You will get an email in the next two days asking about your experience. I hope that your e-visit has been valuable and will speed your recovery.  I have spent 5 minutes in review of e-visit questionnaire, review and updating patient chart, medical decision making and response to patient.   Loraine Grip  Mayers, PA-C

## 2022-07-29 ENCOUNTER — Telehealth: Payer: BC Managed Care – PPO | Admitting: Physician Assistant

## 2022-07-29 DIAGNOSIS — H1032 Unspecified acute conjunctivitis, left eye: Secondary | ICD-10-CM | POA: Diagnosis not present

## 2022-07-29 MED ORDER — POLYMYXIN B-TRIMETHOPRIM 10000-0.1 UNIT/ML-% OP SOLN: OPHTHALMIC | 0 refills | Status: DC

## 2022-07-29 NOTE — Progress Notes (Signed)

## 2022-07-29 NOTE — Progress Notes (Signed)
I have spent 5 minutes in review of e-visit questionnaire, review and updating patient chart, medical decision making and response to patient.   Karleigh Bunte Cody Anil Havard, PA-C    

## 2022-09-28 DIAGNOSIS — Z6829 Body mass index (BMI) 29.0-29.9, adult: Secondary | ICD-10-CM | POA: Diagnosis not present

## 2022-09-28 DIAGNOSIS — Z01419 Encounter for gynecological examination (general) (routine) without abnormal findings: Secondary | ICD-10-CM | POA: Diagnosis not present

## 2022-10-15 DIAGNOSIS — Z1231 Encounter for screening mammogram for malignant neoplasm of breast: Secondary | ICD-10-CM | POA: Diagnosis not present

## 2022-11-05 ENCOUNTER — Other Ambulatory Visit: Payer: Self-pay

## 2022-11-05 ENCOUNTER — Ambulatory Visit: Payer: BC Managed Care – PPO | Admitting: Student

## 2022-11-05 VITALS — BP 126/78 | HR 92 | Ht 68.0 in | Wt 186.8 lb

## 2022-11-05 DIAGNOSIS — L905 Scar conditions and fibrosis of skin: Secondary | ICD-10-CM

## 2022-11-05 DIAGNOSIS — L910 Hypertrophic scar: Secondary | ICD-10-CM | POA: Diagnosis not present

## 2022-11-05 MED ORDER — TRIAMCINOLONE ACETONIDE 0.1 % EX OINT: 1.0000 | TOPICAL_OINTMENT | Freq: Two times a day (BID) | CUTANEOUS | 0 refills | Status: DC

## 2022-11-05 NOTE — Progress Notes (Signed)
     SUBJECTIVE:   CHIEF COMPLAINT / HPI:   Hypertrophy of Scars Patient with a number of old, well-healed scars on her knees that she feels have become hypertrophic over the past week or so.  She does not think that she has had any new exposures or traumas to the area.  She denies any real pain with it but perhaps they are becoming itchy.  She does endorse that she has been scratching the area.  Has not tried anything for it. Denies any fever, fatigue, or systemic symptoms.  OBJECTIVE:   BP 126/78   Pulse 92   Ht 5\' 8"  (1.727 m)   Wt 186 lb 12.8 oz (84.7 kg)   LMP 10/22/2022   SpO2 99%   BMI 28.40 kg/m   Gen: Alert, in good spirits, NAD Skin: Bilateral knees with age-appropriate scarring. The scars are minimally hypertrophic, there is no erythema, or surrounding skin changes.  No hyper or hypopigmentation to the area.  Skin exam is otherwise unremarkable.  ASSESSMENT/PLAN:   Hypertrophic scar of skin Minimal hypertrophy of scars on her knees.  Unclear why they are acutely changing at this time in the absence of exposures or traumas.  No concerning findings on history or exam.  Will trial some topical triamcinolone. -Triamcinolone 0.1% ointment     J Dorothyann Gibbs, MD [facility]

## 2022-11-05 NOTE — Patient Instructions (Addendum)
Marilyn [facility],  We are going to give you some steroids to treat you knees. Dont use this for more than about 2 weeks at a time. I'm not concerned there's anything particularly scary or dangerous going on but the steroids should help to knock down any inflammation that is present.   Eliezer Mccoy, MD

## 2022-11-06 DIAGNOSIS — L910 Hypertrophic scar: Secondary | ICD-10-CM | POA: Insufficient documentation

## 2022-11-06 NOTE — Assessment & Plan Note (Signed)
Minimal hypertrophy of scars on her knees.  Unclear why they are acutely changing at this time in the absence of exposures or traumas.  No concerning findings on history or exam.  Will trial some topical triamcinolone. -Triamcinolone 0.1% ointment

## 2022-11-11 ENCOUNTER — Other Ambulatory Visit (HOSPITAL_COMMUNITY): Payer: Self-pay

## 2022-12-23 ENCOUNTER — Telehealth: Payer: Self-pay | Admitting: Student

## 2022-12-23 NOTE — Telephone Encounter (Signed)
[facility]MT[facility] After Hours Line Phone Call Received after hours phone from patient. Called patient and confirmed name and DOB.   Patient reports stomach cramping ongoing for 6 hours. She has tried tylenol and ibuprofen without relief. Denies nausea, vomiting and diarrhea. Pain rated 7-8/10 located over middle over belly button. She is passing gas and having regular bowel movements. Denies constipation. She denies prior umbilical hernias. She reports the pain is manageable and she is wanting to schedule an appointment tomorrow in the office. She prefers female providers.    We discussed ED precautions. I am routing this note to the PCP and physician seeing patient in clinic next, as appropriate.   Glendale Chard, DO [facility], PGY-2 12/23/22 7:06 PM

## 2022-12-24 ENCOUNTER — Ambulatory Visit: Payer: Self-pay | Admitting: Family Medicine

## 2022-12-24 ENCOUNTER — Ambulatory Visit (INDEPENDENT_AMBULATORY_CARE_PROVIDER_SITE_OTHER): Payer: BC Managed Care – PPO

## 2022-12-24 ENCOUNTER — Ambulatory Visit (HOSPITAL_COMMUNITY)
Admission: EM | Admit: 2022-12-24 | Discharge: 2022-12-24 | Disposition: A | Discharge status: Home / Self Care | Payer: BC Managed Care – PPO | Attending: Internal Medicine | Admitting: Internal Medicine

## 2022-12-24 ENCOUNTER — Encounter (HOSPITAL_COMMUNITY): Payer: Self-pay

## 2022-12-24 DIAGNOSIS — R1030 Lower abdominal pain, unspecified: Secondary | ICD-10-CM | POA: Diagnosis not present

## 2022-12-24 DIAGNOSIS — R109 Unspecified abdominal pain: Secondary | ICD-10-CM | POA: Diagnosis not present

## 2022-12-24 DIAGNOSIS — K5901 Slow transit constipation: Secondary | ICD-10-CM

## 2022-12-24 DIAGNOSIS — R140 Abdominal distension (gaseous): Secondary | ICD-10-CM | POA: Diagnosis not present

## 2022-12-24 NOTE — Discharge Instructions (Addendum)
You have a lot of stool present in your colon and gas.  Get Magnesium Citrate and drink the whole bottle today to help you empty out.

## 2022-12-24 NOTE — ED Triage Notes (Signed)
Patient c/o mid abdominal cramping and bloating since yesterday. Patient denies N/v/D.  Patient reports that she took Midol and took Tylenol yesterday with no relief.

## 2022-12-24 NOTE — ED Provider Notes (Signed)
[facility]    CSN: [id number] Arrival date & time: 12/24/22  0802      History   Chief Complaint Chief Complaint  Patient presents with   Abdominal Pain    HPI Marilyn Luna is a 42 y.o. female who presents with lower abdominal cramping since yesterday. Has trie midol and tylenol without relief. Her period Monday, but does not have cramps like this. Last BM 4 days ago. Denies UTI symptoms. The pain is constant. Denies fever or nausea.     Past Medical History:  Diagnosis Date   Abnormal Pap smear    Chest pain    last week did not go to dr while sleeping x 1 day and once week before that not sure how long lasted   H/O varicella    Hemorrhoids    Hx of chlamydia infection     Patient Active Problem List   Diagnosis Date Noted   Hypertrophic scar of skin 11/06/2022   Hemorrhoids 12/28/2019   Contraceptive management 08/06/2017    Past Surgical History:  Procedure Laterality Date   HEMORRHOID SURGERY N/A 03/27/2020   Procedure: HEMORRHOIDECTOMY;  Surgeon: Romie Levee, MD;  Location: [facility];  Service: General;  Laterality: N/A;   NO PAST SURGERIES     teeth extraction wisdom  yrs ago    OB History     Gravida  1   Para  1   Term  1   Preterm  0   AB  0   Living  1      SAB  0   IAB  0   Ectopic  0   Multiple  0   Live Births  1            Home Medications    Prior to Admission medications   Medication Sig Start Date End Date Taking? Authorizing Provider  aspirin-sod bicarb-citric acid (ALKA-SELTZER) 325 MG TBEF tablet Take 325 mg by mouth every 6 (six) hours as needed. Allergy and sinus    [provider]    Family History Family History  Problem Relation Age of Onset   Asthma Brother    Diabetes Maternal Grandmother    Hypertension Maternal Aunt    Cancer Paternal Uncle        lung    Social History Social History   Tobacco Use   Smoking status: Former    Current packs/day: 0.00     Average packs/day: 0.5 packs/day for 10.0 years (5.0 ttl pk-yrs)    Types: Cigarettes    Start date: 07/05/2007    Quit date: 07/04/2017    Years since quitting: 5.4   Smokeless tobacco: Never  Vaping Use   Vaping status: Never Used  Substance Use Topics   Alcohol use: Not Currently    Alcohol/week: 2.0 standard drinks of alcohol    Types: 2 Glasses of wine per week   Drug use: No     Allergies   Patient has no known allergies.   Review of Systems Review of Systems  Constitutional:  Negative for fever.  Gastrointestinal:  Positive for abdominal distention, abdominal pain and constipation. Negative for nausea and vomiting.  Genitourinary:  Negative for dysuria, frequency and urgency.  Skin:  Negative for rash.  Psychiatric/Behavioral:  Positive for sleep disturbance.        Last night due to pain     Physical Exam Triage Vital Signs ED Triage Vitals  Encounter Vitals Group  BP 12/24/22 0819 116/78     Systolic BP Percentile --      Diastolic BP Percentile --      Pulse Rate 12/24/22 0819 72     Resp 12/24/22 0819 14     Temp 12/24/22 0819 98.1 F (36.7 C)     Temp Source 12/24/22 0819 Oral     SpO2 12/24/22 0819 98 %     Weight --      Height --      Head Circumference --      Peak Flow --      Pain Score 12/24/22 0820 6     Pain Loc --      Pain Education --      Exclude from Growth Chart --    No data found.  Updated Vital Signs BP 116/78 (BP Location: Right Arm)   Pulse 72   Temp 98.1 F (36.7 C) (Oral)   Resp 14   LMP 12/24/2022   SpO2 98%   Visual Acuity Right Eye Distance:   Left Eye Distance:   Bilateral Distance:    Right Eye Near:   Left Eye Near:    Bilateral Near:     Physical Exam Vitals and nursing note reviewed.  Constitutional:      General: She is in acute distress.     Comments: Is in moderate pain, and moving slow due to pain  Pulmonary:     Effort: Pulmonary effort is normal.  Abdominal:     General: Bowel sounds  are normal. There is distension.     Palpations: Abdomen is soft. There is no hepatomegaly or splenomegaly.     Tenderness: There is generalized abdominal tenderness.     Comments: Worse tenderness on LLQ than R   Neurological:     Mental Status: She is alert.      UC Treatments / Results  Labs (all labs ordered are listed, but only abnormal results are displayed) Labs Reviewed - No data to display  EKG   Radiology DG Abd 2 Views  Result Date: 12/24/2022 CLINICAL DATA:  abdominal pain and bloating. Last BM 4 days ago EXAM: ABDOMEN - 2 VIEW COMPARISON:  None Available. FINDINGS: The bowel gas pattern is non-obstructive. There is moderate stool burden, including the ascending colon, compatible with colonic hypomotility. No evidence of pneumoperitoneum, within the limitations of a supine film. No acute osseous abnormalities. The soft tissues are within normal limits. Surgical changes, devices, tubes and lines: None. IMPRESSION: *Nonobstructive bowel gas pattern. Electronically Signed   By: Jules Schick M.D.   On: 12/24/2022 08:52    Procedures Procedures (including critical care time)  Medications Ordered in UC Medications - No data to display  Initial Impression / Assessment and Plan / UC Course  I have reviewed the triage vital signs and the nursing notes.  Pertinent  imaging results that were available during my care of the patient were reviewed by me and considered in my medical decision making (see chart for details).  Constipation Abdominal pain secondary to bloating and constipation  I advised her to Get Magnesium Citrate to help her empty out. If she can't empty out and the pain gets worse, then needs to go to ER.    Final Clinical Impressions(s) / UC Diagnoses   Final diagnoses:  Slow transit constipation  Lower abdominal pain     Discharge Instructions      You have a lot of stool present in your colon and gas.  Get Magnesium Citrate and drink the whole  bottle today to help you empty out.       ED Prescriptions   None    PDMP not reviewed this encounter.   Garey Ham, PA-C 12/24/22 0930

## 2022-12-31 ENCOUNTER — Ambulatory Visit (HOSPITAL_COMMUNITY)
Admission: RE | Admit: 2022-12-31 | Discharge: 2022-12-31 | Disposition: A | Discharge status: Home / Self Care | Payer: BC Managed Care – PPO | Source: Ambulatory Visit | Attending: Family Medicine | Admitting: Family Medicine

## 2022-12-31 ENCOUNTER — Encounter (HOSPITAL_COMMUNITY): Payer: Self-pay

## 2022-12-31 VITALS — BP 127/80 | HR 76 | Temp 98.6°F | Resp 16

## 2022-12-31 DIAGNOSIS — K5900 Constipation, unspecified: Secondary | ICD-10-CM

## 2022-12-31 MED ORDER — LACTULOSE 20 GM/30ML PO SOLN: 30.0000 mL | Freq: Two times a day (BID) | ORAL | 0 refills | Status: AC | PRN

## 2022-12-31 NOTE — ED Triage Notes (Signed)
Pt states she was seen here last week and was told she had a lot of stool in her colon.  States she took magnesium citrate at home x 2 with some small BM's but states she feels like she's not all the way clear.  Last BM was yesterday.

## 2022-12-31 NOTE — ED Provider Notes (Signed)
[facility]   [id number] 12/31/22 Arrival Time: 1735  ASSESSMENT & PLAN:  1. Constipation, unspecified constipation type    Reports no abd pain here. No indications for urgent abdominal/pelvic imaging at this time. . Begin: Meds ordered this encounter  Medications   Lactulose 20 GM/30ML SOLN    Sig: Take 30 mLs (20 g total) by mouth 2 (two) times daily as needed (for constipation).    Dispense:  450 mL    Refill:  0  May try OTC enema.   Follow-up Information     Marilyn Simon, MD.   Specialty: Family Medicine Why: If worsening or failing to improve as anticipated. Contact information: [address] [place] [address] [phone]                Reviewed expectations re: course of current medical issues. Questions answered. Outlined signs and symptoms indicating need for more acute intervention. Patient verbalized understanding. After Visit Summary given.   SUBJECTIVE: History from: patient. Marilyn Luna is a 42 y.o. female who presents with complaint of constipation. Pt states she was seen here last week and was told she had a lot of stool in her colon.  States she took magnesium citrate at home x 2 with some small BM's but states she feels like she's not all the way clear.  Last BM was yesterday. Denies fever.  Patient's last menstrual period was 12/24/2022.  Past Surgical History:  Procedure Laterality Date   HEMORRHOID SURGERY N/A 03/27/2020   Procedure: HEMORRHOIDECTOMY;  Surgeon: Romie Levee, MD;  Location: [facility];  Service: General;  Laterality: N/A;   NO PAST SURGERIES     teeth extraction wisdom  yrs ago     OBJECTIVE:  Vitals:   12/31/22 1801  BP: 127/80  Pulse: 76  Resp: 16  Temp: 98.6 F (37 C)  TempSrc: Oral  SpO2: 98%    General appearance: alert, oriented, no acute distress Skin: warm and dry Psychological: alert and cooperative; normal mood and affect  No Known Allergies                                              Past Medical History:  Diagnosis Date   Abnormal Pap smear    Chest pain    last week did not go to dr while sleeping x 1 day and once week before that not sure how long lasted   H/O varicella    Hemorrhoids    Hx of chlamydia infection     Social History   Socioeconomic History   Marital status: Single    Spouse name: Not on file   Number of children: Not on file   Years of education: Not on file   Highest education level: Not on file  Occupational History   Not on file  Tobacco Use   Smoking status: Former    Current packs/day: 0.00    Average packs/day: 0.5 packs/day for 10.0 years (5.0 ttl pk-yrs)    Types: Cigarettes    Start date: 07/05/2007    Quit date: 07/04/2017    Years since quitting: 5.4   Smokeless tobacco: Never  Vaping Use   Vaping status: Never Used  Substance and Sexual Activity   Alcohol use: Not Currently    Alcohol/week: 2.0 standard drinks of alcohol    Types: 2 Glasses of  wine per week   Drug use: No   Sexual activity: Yes  Other Topics Concern   Not on file  Social History Narrative   Not on file   Social Determinants of Health   Financial Resource Strain: Unknown (04/24/2021)   Received from [facility]   Overall Financial Resource Strain (CARDIA)    Difficulty of Paying Living Expenses: Patient declined  Food Insecurity: Unknown (04/24/2021)   Received from [facility]   Hunger Vital Sign    Worried About Running Out of Food in the Last Year: Patient declined    Ran Out of Food in the Last Year: Patient declined  Transportation Needs: No Transportation Needs (04/24/2021)   Received from [facility] - Transportation    Lack of Transportation (Medical): No    Lack of Transportation (Non-Medical): No  Physical Activity: Sufficiently Active (04/24/2021)   Received from [facility]   Exercise Vital Sign    Days of Exercise per Week: 4 days    Minutes of Exercise per Session: 50 min  Stress:  No Stress Concern Present (04/24/2021)   Received from [facility] - Occupational Stress Questionnaire    Feeling of Stress : Not at all  Social Connections: Unknown (10/23/2021)   Received from [facility]   Social Network    Social Network: Not on file  Intimate Partner Violence: Unknown (09/17/2021)   Received from [facility]   HITS    Physically Hurt: Not on file    Insult or Talk Down To: Not on file    Threaten Physical Harm: Not on file    Scream or Curse: Not on file    Family History  Problem Relation Age of Onset   Asthma Brother    Diabetes Maternal Grandmother    Hypertension Maternal Aunt    Cancer Paternal Uncle        lung     Mardella Layman, MD 12/31/22 [phone]

## 2023-03-02 DIAGNOSIS — M79642 Pain in left hand: Secondary | ICD-10-CM | POA: Diagnosis not present

## 2023-03-02 DIAGNOSIS — S62307D Unspecified fracture of fifth metacarpal bone, left hand, subsequent encounter for fracture with routine healing: Secondary | ICD-10-CM | POA: Diagnosis not present

## 2023-06-29 DIAGNOSIS — H00021 Hordeolum internum right upper eyelid: Secondary | ICD-10-CM | POA: Diagnosis not present

## 2023-07-13 DIAGNOSIS — H16041 Marginal corneal ulcer, right eye: Secondary | ICD-10-CM | POA: Diagnosis not present

## 2023-11-10 DIAGNOSIS — Z01419 Encounter for gynecological examination (general) (routine) without abnormal findings: Secondary | ICD-10-CM | POA: Diagnosis not present

## 2023-11-10 DIAGNOSIS — Z1231 Encounter for screening mammogram for malignant neoplasm of breast: Secondary | ICD-10-CM | POA: Diagnosis not present

## 2024-02-22 DIAGNOSIS — Z13220 Encounter for screening for lipoid disorders: Secondary | ICD-10-CM | POA: Diagnosis not present

## 2024-02-22 DIAGNOSIS — Z0000 Encounter for general adult medical examination without abnormal findings: Secondary | ICD-10-CM | POA: Diagnosis not present

## 2024-02-22 DIAGNOSIS — Z1389 Encounter for screening for other disorder: Secondary | ICD-10-CM | POA: Diagnosis not present

## 2024-02-22 DIAGNOSIS — Z23 Encounter for immunization: Secondary | ICD-10-CM | POA: Diagnosis not present
